# Patient Record
Sex: Female | Born: 1935 | Race: White | Hispanic: No | Marital: Married | Smoking: Former smoker
Health system: Southern US, Community
[De-identification: ages and names within clinical notes are randomized; demographics above are authoritative.]

## PROBLEM LIST (undated history)

## (undated) DIAGNOSIS — K5792 Diverticulitis of intestine, part unspecified, without perforation or abscess without bleeding: Secondary | ICD-10-CM

## (undated) DIAGNOSIS — E538 Deficiency of other specified B group vitamins: Secondary | ICD-10-CM

## (undated) DIAGNOSIS — R413 Other amnesia: Secondary | ICD-10-CM

## (undated) DIAGNOSIS — I10 Essential (primary) hypertension: Secondary | ICD-10-CM

## (undated) DIAGNOSIS — M8580 Other specified disorders of bone density and structure, unspecified site: Secondary | ICD-10-CM

## (undated) DIAGNOSIS — K219 Gastro-esophageal reflux disease without esophagitis: Secondary | ICD-10-CM

## (undated) DIAGNOSIS — E785 Hyperlipidemia, unspecified: Secondary | ICD-10-CM

## (undated) DIAGNOSIS — E039 Hypothyroidism, unspecified: Secondary | ICD-10-CM

## (undated) DIAGNOSIS — Z8679 Personal history of other diseases of the circulatory system: Secondary | ICD-10-CM

## (undated) DIAGNOSIS — K635 Polyp of colon: Secondary | ICD-10-CM

## (undated) DIAGNOSIS — M1990 Unspecified osteoarthritis, unspecified site: Secondary | ICD-10-CM

## (undated) DIAGNOSIS — K644 Residual hemorrhoidal skin tags: Secondary | ICD-10-CM

## (undated) DIAGNOSIS — E559 Vitamin D deficiency, unspecified: Secondary | ICD-10-CM

## (undated) HISTORY — DX: Essential (primary) hypertension: I10

## (undated) HISTORY — DX: Residual hemorrhoidal skin tags: K64.4

## (undated) HISTORY — PX: BREAST CYST EXCISION: SHX579

## (undated) HISTORY — DX: Polyp of colon: K63.5

## (undated) HISTORY — DX: Diverticulitis of intestine, part unspecified, without perforation or abscess without bleeding: K57.92

## (undated) HISTORY — DX: Unspecified osteoarthritis, unspecified site: M19.90

## (undated) HISTORY — DX: Gastro-esophageal reflux disease without esophagitis: K21.9

## (undated) HISTORY — PX: ABDOMINAL HYSTERECTOMY: SHX81

## (undated) HISTORY — DX: Other amnesia: R41.3

## (undated) HISTORY — DX: Other specified disorders of bone density and structure, unspecified site: M85.80

## (undated) HISTORY — PX: TONSILLECTOMY: SUR1361

## (undated) HISTORY — DX: Hyperlipidemia, unspecified: E78.5

## (undated) HISTORY — DX: Deficiency of other specified B group vitamins: E53.8

## (undated) HISTORY — DX: Hypothyroidism, unspecified: E03.9

## (undated) HISTORY — DX: Vitamin D deficiency, unspecified: E55.9

## (undated) HISTORY — DX: Personal history of other diseases of the circulatory system: Z86.79

---

## 2000-01-09 ENCOUNTER — Encounter: Admission: RE | Admit: 2000-01-09 | Discharge: 2000-01-09 | Payer: Self-pay | Admitting: Cardiology

## 2000-01-09 ENCOUNTER — Encounter: Payer: Self-pay | Admitting: Cardiology

## 2001-02-01 ENCOUNTER — Encounter: Payer: Self-pay | Admitting: Internal Medicine

## 2001-02-01 ENCOUNTER — Encounter: Admission: RE | Admit: 2001-02-01 | Discharge: 2001-02-01 | Payer: Self-pay | Admitting: Internal Medicine

## 2001-02-02 ENCOUNTER — Other Ambulatory Visit: Admission: RE | Admit: 2001-02-02 | Discharge: 2001-02-02 | Payer: Self-pay | Admitting: Internal Medicine

## 2001-08-17 ENCOUNTER — Encounter: Admission: RE | Admit: 2001-08-17 | Discharge: 2001-08-17 | Payer: Self-pay | Admitting: Internal Medicine

## 2001-08-17 ENCOUNTER — Encounter: Payer: Self-pay | Admitting: Internal Medicine

## 2001-09-02 ENCOUNTER — Encounter: Payer: Self-pay | Admitting: Internal Medicine

## 2001-09-02 ENCOUNTER — Encounter (INDEPENDENT_AMBULATORY_CARE_PROVIDER_SITE_OTHER): Payer: Self-pay | Admitting: Specialist

## 2001-09-02 ENCOUNTER — Ambulatory Visit (HOSPITAL_COMMUNITY): Admission: RE | Admit: 2001-09-02 | Discharge: 2001-09-02 | Payer: Self-pay | Admitting: Internal Medicine

## 2001-09-05 ENCOUNTER — Encounter: Payer: Self-pay | Admitting: Internal Medicine

## 2001-09-05 ENCOUNTER — Ambulatory Visit (HOSPITAL_COMMUNITY): Admission: RE | Admit: 2001-09-05 | Discharge: 2001-09-05 | Payer: Self-pay | Admitting: Internal Medicine

## 2002-02-06 ENCOUNTER — Encounter: Admission: RE | Admit: 2002-02-06 | Discharge: 2002-02-06 | Payer: Self-pay | Admitting: Internal Medicine

## 2002-02-06 ENCOUNTER — Encounter: Payer: Self-pay | Admitting: Internal Medicine

## 2003-02-21 ENCOUNTER — Encounter: Admission: RE | Admit: 2003-02-21 | Discharge: 2003-02-21 | Payer: Self-pay | Admitting: Internal Medicine

## 2003-02-21 ENCOUNTER — Encounter: Payer: Self-pay | Admitting: Internal Medicine

## 2003-04-10 ENCOUNTER — Encounter: Payer: Self-pay | Admitting: Internal Medicine

## 2003-04-10 ENCOUNTER — Encounter: Admission: RE | Admit: 2003-04-10 | Discharge: 2003-04-10 | Payer: Self-pay | Admitting: Internal Medicine

## 2003-11-21 ENCOUNTER — Other Ambulatory Visit: Payer: Self-pay

## 2004-10-30 ENCOUNTER — Ambulatory Visit (HOSPITAL_COMMUNITY): Admission: RE | Admit: 2004-10-30 | Discharge: 2004-10-30 | Payer: Self-pay | Admitting: Internal Medicine

## 2004-11-13 ENCOUNTER — Ambulatory Visit: Payer: Self-pay | Admitting: Internal Medicine

## 2006-02-10 ENCOUNTER — Ambulatory Visit (HOSPITAL_COMMUNITY): Admission: RE | Admit: 2006-02-10 | Discharge: 2006-02-10 | Payer: Self-pay | Admitting: Internal Medicine

## 2006-05-11 ENCOUNTER — Ambulatory Visit: Payer: Self-pay | Admitting: Internal Medicine

## 2006-08-02 ENCOUNTER — Encounter: Admission: RE | Admit: 2006-08-02 | Discharge: 2006-08-02 | Payer: Self-pay | Admitting: Internal Medicine

## 2007-07-09 ENCOUNTER — Emergency Department (HOSPITAL_COMMUNITY): Admission: EM | Admit: 2007-07-09 | Discharge: 2007-07-09 | Payer: Self-pay | Admitting: Emergency Medicine

## 2008-04-03 ENCOUNTER — Ambulatory Visit (HOSPITAL_COMMUNITY): Admission: RE | Admit: 2008-04-03 | Discharge: 2008-04-03 | Payer: Self-pay | Admitting: Internal Medicine

## 2008-09-19 DIAGNOSIS — Z860100 Personal history of colon polyps, unspecified: Secondary | ICD-10-CM | POA: Insufficient documentation

## 2008-09-19 DIAGNOSIS — K5732 Diverticulitis of large intestine without perforation or abscess without bleeding: Secondary | ICD-10-CM | POA: Insufficient documentation

## 2008-09-19 DIAGNOSIS — E039 Hypothyroidism, unspecified: Secondary | ICD-10-CM | POA: Insufficient documentation

## 2008-09-19 DIAGNOSIS — K644 Residual hemorrhoidal skin tags: Secondary | ICD-10-CM | POA: Insufficient documentation

## 2008-09-19 DIAGNOSIS — I10 Essential (primary) hypertension: Secondary | ICD-10-CM | POA: Insufficient documentation

## 2008-09-19 DIAGNOSIS — K648 Other hemorrhoids: Secondary | ICD-10-CM | POA: Insufficient documentation

## 2008-09-21 ENCOUNTER — Encounter: Payer: Self-pay | Admitting: Internal Medicine

## 2008-12-17 ENCOUNTER — Ambulatory Visit: Payer: Self-pay | Admitting: Internal Medicine

## 2008-12-28 ENCOUNTER — Encounter: Payer: Self-pay | Admitting: Internal Medicine

## 2008-12-28 ENCOUNTER — Ambulatory Visit: Payer: Self-pay | Admitting: Internal Medicine

## 2009-01-01 ENCOUNTER — Encounter: Payer: Self-pay | Admitting: Internal Medicine

## 2009-03-26 ENCOUNTER — Ambulatory Visit: Payer: Self-pay | Admitting: Internal Medicine

## 2009-03-26 DIAGNOSIS — K625 Hemorrhage of anus and rectum: Secondary | ICD-10-CM | POA: Insufficient documentation

## 2009-04-23 ENCOUNTER — Ambulatory Visit (HOSPITAL_COMMUNITY): Admission: RE | Admit: 2009-04-23 | Discharge: 2009-04-23 | Payer: Self-pay | Admitting: Internal Medicine

## 2010-04-24 ENCOUNTER — Ambulatory Visit (HOSPITAL_COMMUNITY): Admission: RE | Admit: 2010-04-24 | Discharge: 2010-04-24 | Payer: Self-pay | Admitting: Internal Medicine

## 2010-11-14 ENCOUNTER — Other Ambulatory Visit: Payer: Self-pay | Admitting: Internal Medicine

## 2010-11-14 DIAGNOSIS — N6321 Unspecified lump in the left breast, upper outer quadrant: Secondary | ICD-10-CM

## 2010-11-18 ENCOUNTER — Ambulatory Visit
Admission: RE | Admit: 2010-11-18 | Discharge: 2010-11-18 | Disposition: A | Discharge status: Home / Self Care | Payer: Medicare Other | Source: Ambulatory Visit | Attending: Internal Medicine | Admitting: Internal Medicine

## 2010-11-18 ENCOUNTER — Ambulatory Visit
Admission: RE | Admit: 2010-11-18 | Discharge: 2010-11-18 | Disposition: A | Discharge status: Home / Self Care | Payer: Self-pay | Source: Ambulatory Visit | Attending: Internal Medicine | Admitting: Internal Medicine

## 2010-11-18 DIAGNOSIS — N6321 Unspecified lump in the left breast, upper outer quadrant: Secondary | ICD-10-CM

## 2010-11-19 ENCOUNTER — Other Ambulatory Visit: Payer: Self-pay

## 2010-12-15 ENCOUNTER — Other Ambulatory Visit: Payer: Self-pay | Admitting: Internal Medicine

## 2010-12-15 DIAGNOSIS — N63 Unspecified lump in unspecified breast: Secondary | ICD-10-CM

## 2010-12-15 DIAGNOSIS — N6489 Other specified disorders of breast: Secondary | ICD-10-CM

## 2010-12-15 DIAGNOSIS — N644 Mastodynia: Secondary | ICD-10-CM

## 2010-12-18 ENCOUNTER — Ambulatory Visit
Admission: RE | Admit: 2010-12-18 | Discharge: 2010-12-18 | Disposition: A | Discharge status: Home / Self Care | Payer: Medicare Other | Source: Ambulatory Visit | Attending: Internal Medicine | Admitting: Internal Medicine

## 2010-12-18 ENCOUNTER — Ambulatory Visit: Payer: Medicare Other

## 2010-12-18 DIAGNOSIS — N6489 Other specified disorders of breast: Secondary | ICD-10-CM

## 2010-12-25 ENCOUNTER — Ambulatory Visit
Admission: RE | Admit: 2010-12-25 | Discharge: 2010-12-25 | Disposition: A | Discharge status: Home / Self Care | Payer: Medicare Other | Source: Ambulatory Visit | Attending: Internal Medicine | Admitting: Internal Medicine

## 2010-12-25 DIAGNOSIS — N644 Mastodynia: Secondary | ICD-10-CM

## 2010-12-25 DIAGNOSIS — N63 Unspecified lump in unspecified breast: Secondary | ICD-10-CM

## 2010-12-25 MED ORDER — GADOBENATE DIMEGLUMINE 529 MG/ML IV SOLN
11.0000 mL | Freq: Once | INTRAVENOUS | Status: AC | PRN
  Administered 2010-12-25: 11 mL via INTRAVENOUS

## 2010-12-26 ENCOUNTER — Ambulatory Visit
Admission: RE | Admit: 2010-12-26 | Discharge: 2010-12-26 | Disposition: A | Discharge status: Home / Self Care | Payer: Medicare Other | Source: Ambulatory Visit | Attending: Internal Medicine | Admitting: Internal Medicine

## 2010-12-26 ENCOUNTER — Other Ambulatory Visit: Payer: Self-pay | Admitting: Internal Medicine

## 2010-12-26 DIAGNOSIS — R928 Other abnormal and inconclusive findings on diagnostic imaging of breast: Secondary | ICD-10-CM

## 2010-12-31 ENCOUNTER — Ambulatory Visit
Admission: RE | Admit: 2010-12-31 | Discharge: 2010-12-31 | Disposition: A | Discharge status: Home / Self Care | Payer: Medicare Other | Source: Ambulatory Visit | Attending: Internal Medicine | Admitting: Internal Medicine

## 2010-12-31 ENCOUNTER — Other Ambulatory Visit: Payer: Self-pay | Admitting: Diagnostic Radiology

## 2010-12-31 ENCOUNTER — Other Ambulatory Visit: Payer: Self-pay | Admitting: Internal Medicine

## 2010-12-31 DIAGNOSIS — R928 Other abnormal and inconclusive findings on diagnostic imaging of breast: Secondary | ICD-10-CM

## 2010-12-31 DIAGNOSIS — N63 Unspecified lump in unspecified breast: Secondary | ICD-10-CM

## 2010-12-31 MED ORDER — GADOBENATE DIMEGLUMINE 529 MG/ML IV SOLN
11.0000 mL | Freq: Once | INTRAVENOUS | Status: AC | PRN
  Administered 2010-12-31: 11 mL via INTRAVENOUS

## 2011-02-27 NOTE — Assessment & Plan Note (Signed)
[facility]                           GASTROENTEROLOGY OFFICE NOTE   Deborah Sanders, Deborah Sanders                       MRN:          [id number]  DATE:05/11/2006                            DOB:          Aug 13, 1931    Referring physician Dr. Rodrigo Ran.   REASON FOR CONSULTATION:  Rectal bleeding.   HISTORY:  This is a 77 year old white female with a history of complicated  diverticulitis for which she underwent segmental colonic resection in 1984.  She also has a history of hypothyroidism, hypertension, and transient  ischemic attack.  She last underwent complete colonoscopy in November of  2002.  She was found to have internal and external hemorrhoids, pan  diverticulosis, and multiple diminutive polyps which were removed and found  to be hyperplastic.  Followup in five years recommended.  She was last seen  in the office November 13, 2004 with diverticulitis successfully treated with  antibiotics.  She presents now with a new problem.  In particular, rectal  bleeding.  Patient will occasionally notice some blood in the stool and  toilet bowel.  However her principal problem is that of intermittent bright  red blood noted on her underwear.  This has occurred principally over the  past four to five weeks.  There has been no associated abdominal or rectal  discomfort with the bleeding.  However she will have some rectal discomfort  periodically due to note hemorrhoids for which she self treats with sitz  baths and some topical creams.  She denies nausea, vomiting, fever, change  in bowel habits or unexplained weight loss.  She recently saw Dr. Waynard Edwards who  advised the patient to be seen to discuss this problem as well as schedule  her surveillance colonoscopy.   CURRENT MEDICATIONS:  1.  Synthroid 112 micrograms daily.  2.  Ziac 10/6.25 mg daily.  3.  Premarin 0.3 mg daily.  4.  Calcium.  5.  Baby aspirin.  6.  Multivitamin.  7.  Vitamin D, E, and  B12.  8.  Vytorin 10/40 mg daily.  9.  Benicar 20 mg daily.  10. B Complex.   ALLERGIES:  MYCINs, CODEINE, DEMEROL .   PAST MEDICAL HISTORY:  As above.   PAST SURGICAL HISTORY:  1.  Partial colectomy for diverticulitis.  2.  Hysterectomy and bilateral oophorectomy.  3.  Bladder tact.  4.  Tonsillectomy.   FAMILY HISTORY:  Negative for gastrointestinal malignancy.   SOCIAL HISTORY:  Patient is married with three children.  She is a retired  Occupational psychologist.  She does not smoke and occasionally uses alcohol.   PHYSICAL EXAMINATION:  GENERAL:  Well appearing female in no acute distress.  VITAL SIGNS:  Blood pressure is 124/80.  Heart rate is 60 and regular.  Weight is 137 pounds.  HEENT:  Sclerae anicteric, conjunctiva are pink.  Oral mucosa intact.  No  adenopathy.  LUNGS:  Clear.  HEART:  Regular.  ABDOMEN:  Soft without tenderness, mass or hernia.  RECTAL:  Reveals external hemorrhoids one of which is clearly inflamed and  tender.  No mass.  EXTREMITIES:  Without edema.   IMPRESSION:  1.  Intermittent rectal bleeding likely due to inflamed external hemorrhoid.  2.  History of diverticulosis complicated by diverticulitis requiring      surgery.  3.  History of colon polyps namely hyperplastic.  Due for followup.  4.  Multiple general medical problems.   RECOMMENDATIONS:  1.  Information on hemorrhoid care.  2.  Sitz baths.  3.  Anusol suppositories at night.  4.  Schedule followup colonoscopy.  The nature of the procedure as well as      the risks, benefits, alternatives have been reviewed. She understands      and has agreed to proceed.  We will provide Osmo prep as an alternative      to the GoLYTELY which she did not tolerate well.                                   Wilhemina Bonito. Eda Keys., MD   JNP/MedQ  DD:  05/11/2006  DT:  05/11/2006  Job #:  [id number]   cc:   Loraine Leriche A. Perini, MD

## 2011-07-24 ENCOUNTER — Emergency Department: Payer: Self-pay | Admitting: Unknown Physician Specialty

## 2012-01-18 ENCOUNTER — Emergency Department: Payer: Self-pay | Admitting: Emergency Medicine

## 2012-04-22 DIAGNOSIS — F079 Unspecified personality and behavioral disorder due to known physiological condition: Secondary | ICD-10-CM | POA: Insufficient documentation

## 2013-03-08 ENCOUNTER — Ambulatory Visit: Payer: Self-pay | Admitting: Neurology

## 2013-03-29 ENCOUNTER — Encounter: Payer: Self-pay | Admitting: Neurology

## 2013-03-29 DIAGNOSIS — F079 Unspecified personality and behavioral disorder due to known physiological condition: Secondary | ICD-10-CM

## 2013-04-10 ENCOUNTER — Ambulatory Visit: Payer: Self-pay | Admitting: Neurology

## 2013-04-10 ENCOUNTER — Encounter: Payer: Self-pay | Admitting: Neurology

## 2013-05-29 ENCOUNTER — Telehealth: Payer: Self-pay | Admitting: Internal Medicine

## 2013-05-29 NOTE — Telephone Encounter (Signed)
Pt having abdominal pain. Dr. Waynard Edwards requests pt be seen as soon as possible, thinks pt may be having a diverticulitis flare. Pt scheduled to see Doug Sou PA tomorrow at 10:30am. Office to fax over records and notify pt of appt date and time.

## 2013-05-30 ENCOUNTER — Ambulatory Visit (INDEPENDENT_AMBULATORY_CARE_PROVIDER_SITE_OTHER): Payer: Medicare Other | Admitting: Gastroenterology

## 2013-05-30 ENCOUNTER — Encounter: Payer: Self-pay | Admitting: Gastroenterology

## 2013-05-30 ENCOUNTER — Telehealth: Payer: Self-pay | Admitting: *Deleted

## 2013-05-30 VITALS — BP 100/60 | HR 58 | Ht 61.0 in | Wt 130.0 lb

## 2013-05-30 DIAGNOSIS — R1032 Left lower quadrant pain: Secondary | ICD-10-CM

## 2013-05-30 MED ORDER — ONDANSETRON HCL 4 MG PO TABS: ORAL_TABLET | ORAL | Status: DC

## 2013-05-30 MED ORDER — HYOSCYAMINE SULFATE 0.125 MG SL SUBL: SUBLINGUAL_TABLET | SUBLINGUAL | Status: DC

## 2013-05-30 MED ORDER — AMOXICILLIN-POT CLAVULANATE 875-125 MG PO TABS: ORAL_TABLET | ORAL | Status: DC

## 2013-05-30 NOTE — Patient Instructions (Addendum)
[facility], [address] [place], [place].  1. zofran ( onddansetron ) 2. Levsin SL ( Hyoscyamine )  3. augmentin 875/175 mg

## 2013-05-30 NOTE — Telephone Encounter (Signed)
I called and spoke to a nurse, Lorene Dy at [employer]. I did a Prior Authorization for nausea, Ondansetron 4 mg, # 20 , 0 refills.  The nurse asked me the reason for the Prior Authorization and I told hier Nausea, Diverticulitis, Abd discomfort.  She approved this prescription  From 05-30-2013 to 05-30-2014.  The patient will be notified by outbound call and or letter. I [facility] [address], [place], [place] to advise.

## 2013-05-30 NOTE — Telephone Encounter (Signed)
I spoke to the patient and advised her the prescription is ready for her to pick up at [facility].  I let her know we called [facility] and they approved the Ondansetrton 4 mg.  She thanked me for calling.

## 2013-05-31 ENCOUNTER — Encounter: Payer: Self-pay | Admitting: Gastroenterology

## 2013-05-31 DIAGNOSIS — R1032 Left lower quadrant pain: Secondary | ICD-10-CM | POA: Insufficient documentation

## 2013-05-31 NOTE — Progress Notes (Signed)
05/31/2013 Deborah Sanders [id number] [birth year]   History of Present Illness:  This is an 79 year old female who is known to Dr. Marina Goodell.  She presents to our office today with her husband due to complaints of LLQ abdominal that has been ongoing for the past couple of weeks.  Started with LLQ abdominal pain about three weeks ago.  Has seen PCP and CT scan was ordered, which did not show any evidence of diverticulitis or pain causing etiology.  CBC was ordered as well.  She was still treated with a course of levoquin and flagyl, however, she is still having pain to the same degree as it was previously.  Pain is constant but does not affect her sleep.  It is described as cramping pain.  Denies blood in stool.  Has some nausea and diarrhea as well, but they have contributed that to the antibiotics themselves.  Has no appetite.  Denies fevers or chills.  She has phenergan to take for the nausea, but patient's husband says that is just "knocks her out" and is asking to try something different.  Her last colonoscopy was in 12/2008 at which time she had moderate diverticulosis throughout the colon and a small tubular adenoma removed with recommended repeat colonoscopy in 5 years from that time.  Her husband helps to give a lot of the story and history since she does have some memory problems with mild dementia.  Current Medications, Allergies, Past Medical History, Past Surgical History, Family History and Social History were reviewed in [employer] record.   Physical Exam: BP 100/60  Pulse 58  Ht 5\' 1"  (1.549 m)  Wt 130 lb (58.968 kg)  BMI 24.58 kg/m2  SpO2 97% General:  Elderly white female in no acute distress Head: Normocephalic and atraumatic Eyes:  sclerae anicteric, conjunctiva pink  Ears: Normal auditory acuity Lungs: Clear throughout to auscultation Heart: Regular rate and rhythm Abdomen: Soft, non-distended.  Bowel sounds present.  Moderate LLQ TTP without  R/R/G. Musculoskeletal: Symmetrical with no gross deformities  Extremities: No edema  Neurological: Alert oriented x 4, grossly nonfocal Psychological:  Alert and cooperative. Normal mood and affect.  Somewhat confused at times.  Assessment and Recommendations: -LLQ abdominal pain:  Is consistent with diverticulitis and feels the same as previous episodes.  No evidence on CT scan and not responding to levo/flagyl after two weeks of treatment.  Will switch antibiotic to Augmentin 875 BID for ten days.  Call with update at the end of the week.  If no improvement ? Repeat CT scan vs flex sig for further evaluation.  Drink plenty of fluids and eat a low fiber diet.  Also gave a prescription for levsin to take every 6 hours as needed for abdominal cramping/spasm.  Zofran given for nausea.

## 2013-06-01 NOTE — Progress Notes (Signed)
Agree with initial assessment and plans 

## 2013-07-28 ENCOUNTER — Encounter (INDEPENDENT_AMBULATORY_CARE_PROVIDER_SITE_OTHER): Payer: Self-pay

## 2013-07-28 DIAGNOSIS — Z0289 Encounter for other administrative examinations: Secondary | ICD-10-CM

## 2013-08-01 ENCOUNTER — Other Ambulatory Visit: Payer: Self-pay | Admitting: *Deleted

## 2013-08-01 DIAGNOSIS — F0280 Dementia in other diseases classified elsewhere without behavioral disturbance: Secondary | ICD-10-CM

## 2013-08-18 ENCOUNTER — Other Ambulatory Visit: Payer: Medicare Other

## 2013-08-18 ENCOUNTER — Ambulatory Visit
Admission: RE | Admit: 2013-08-18 | Discharge: 2013-08-18 | Disposition: A | Discharge status: Home / Self Care | Payer: No Typology Code available for payment source | Source: Ambulatory Visit | Attending: Neurology | Admitting: Neurology

## 2013-08-18 DIAGNOSIS — F0280 Dementia in other diseases classified elsewhere without behavioral disturbance: Secondary | ICD-10-CM

## 2013-11-14 ENCOUNTER — Telehealth: Payer: Self-pay | Admitting: Neurology

## 2013-11-14 NOTE — Telephone Encounter (Signed)
During appointment reminder call, Pt's husband asked if someone will have ready for them the information regarding the MRI that is scheduled for Frida.  They need to know when and where the appointment is.  He said that the appointment was [facility].  Please call

## 2013-11-15 NOTE — Telephone Encounter (Signed)
I spoke to pts husband.  Some confusion about tests pt is scheduled for.  Sherilyn CooterHenry, husband is saying that [facility] [place] [facility] at around 1300, early afternoon.  [facility].   If there is any change to these, please call Sherilyn CooterHenry.  Cell # [phone]

## 2013-11-15 NOTE — Telephone Encounter (Signed)
Returned call to patient, lt VM message for call back concerning MRI, not seen  scheduled in Epic

## 2013-11-16 ENCOUNTER — Encounter (INDEPENDENT_AMBULATORY_CARE_PROVIDER_SITE_OTHER): Payer: Self-pay

## 2013-11-16 DIAGNOSIS — Z0289 Encounter for other administrative examinations: Secondary | ICD-10-CM

## 2013-11-20 ENCOUNTER — Encounter: Payer: Self-pay | Admitting: Internal Medicine

## 2013-12-22 ENCOUNTER — Other Ambulatory Visit: Payer: Self-pay | Admitting: *Deleted

## 2013-12-22 ENCOUNTER — Encounter (INDEPENDENT_AMBULATORY_CARE_PROVIDER_SITE_OTHER): Payer: Self-pay

## 2013-12-22 DIAGNOSIS — Z0289 Encounter for other administrative examinations: Secondary | ICD-10-CM

## 2014-01-19 ENCOUNTER — Encounter (INDEPENDENT_AMBULATORY_CARE_PROVIDER_SITE_OTHER): Payer: Self-pay | Admitting: Neurology

## 2014-01-19 DIAGNOSIS — Z0289 Encounter for other administrative examinations: Secondary | ICD-10-CM

## 2014-02-16 ENCOUNTER — Ambulatory Visit (INDEPENDENT_AMBULATORY_CARE_PROVIDER_SITE_OTHER): Payer: Self-pay | Admitting: Neurology

## 2014-02-16 DIAGNOSIS — G309 Alzheimer's disease, unspecified: Principal | ICD-10-CM

## 2014-02-16 DIAGNOSIS — F0280 Dementia in other diseases classified elsewhere without behavioral disturbance: Secondary | ICD-10-CM

## 2014-02-16 NOTE — Progress Notes (Signed)
Patient was here for follow in the EXPEDITION 3 trial.

## 2014-03-19 ENCOUNTER — Other Ambulatory Visit: Payer: Self-pay

## 2014-05-11 ENCOUNTER — Encounter (INDEPENDENT_AMBULATORY_CARE_PROVIDER_SITE_OTHER): Payer: Self-pay

## 2014-05-11 DIAGNOSIS — Z0289 Encounter for other administrative examinations: Secondary | ICD-10-CM

## 2014-05-15 ENCOUNTER — Encounter: Payer: Self-pay | Admitting: Internal Medicine

## 2014-05-15 ENCOUNTER — Ambulatory Visit (INDEPENDENT_AMBULATORY_CARE_PROVIDER_SITE_OTHER): Payer: Medicare Other | Admitting: Internal Medicine

## 2014-05-15 VITALS — BP 142/96 | HR 72 | Ht 60.0 in | Wt 129.0 lb

## 2014-05-15 DIAGNOSIS — R1032 Left lower quadrant pain: Secondary | ICD-10-CM

## 2014-05-15 DIAGNOSIS — K5732 Diverticulitis of large intestine without perforation or abscess without bleeding: Secondary | ICD-10-CM

## 2014-05-15 DIAGNOSIS — Z86010 Personal history of colonic polyps: Secondary | ICD-10-CM

## 2014-05-15 NOTE — Patient Instructions (Signed)
Please follow up with Dr. Perry as needed 

## 2014-05-15 NOTE — Progress Notes (Signed)
HISTORY OF PRESENT ILLNESS:  Deborah Sanders is a 80 y.o. female with multiple medical problems as listed below. She sent today by Dr. Waynard EdwardsPerini for evaluation of abdominal pain. The patient has a history of complicated diverticulitis for which she underwent partial colectomy. She is also undergone screening and surveillance colonoscopy. She was last seen in the office August 2014 regarding left lower cord abdominal pain despite antibiotics. CT scan at that time did not reveal acute diverticulitis. She was empirically treated with additional antibiotic, given antispasmodic, and Zofran for nausea. Symptoms resolved without recurrence until about 2-3 weeks ago when she developed left lower quadrant pain. She contacted Dr. Waynard EdwardsPerini and was prescribed Levaquin for 7 days. The pain persisted. No associated fever. Slight loose stools. Thereafter she was prescribed Cipro and Flagyl. She continues on Cipro and Flagyl. As of today, her pain has resolved. She is accompanied by her husband. The patient has memory impairment. Her last complete colonoscopy was performed March 2010. She was found to have moderate diverticulosis throughout the colon, as well as internal hemorrhoids and a diminutive polyp which was removed and found to be a benign tubular adenoma.  REVIEW OF SYSTEMS:  All non-GI ROS negative except for memory impairment  Past Medical History  Diagnosis Date  . Memory disturbance   . Hypertension   . Hypothyroidism   . Vitamin B12 deficiency   . Dyslipidemia   . History of cerebrovascular disease     TIA in the past  . GERD (gastroesophageal reflux disease)   . Osteopenia   . Vitamin D deficiency   . Colon polyps     adenomatous  . Degenerative arthritis   . Diverticulitis     with prior surgeries  . External hemorrhoids     Past Surgical History  Procedure Laterality Date  . Tonsillectomy    . Abdominal hysterectomy    . Breast cyst excision      Social History Deborah AlconJane K Sanders  reports  that she quit smoking about 50 years ago. Her smoking use included Cigarettes. She smoked 0.00 packs per day. She does not have any smokeless tobacco history on file. She reports that she does not drink alcohol or use illicit drugs.  family history includes Dementia in her maternal aunt and mother; Heart disease in her father; Stroke in her mother.  Allergies  Allergen Reactions  . Codeine     REACTION: nausea, voming, sedated  . Erythromycin     REACTION: nausea, vomiting  . Meperidine Hcl     REACTION: nausea, vomiting       PHYSICAL EXAMINATION: Vital signs: BP 142/96  Pulse 72  Ht 5' (1.524 m)  Wt 129 lb (58.514 kg)  BMI 25.19 kg/m2 General: Well-developed, well-nourished, no acute distress HEENT: Sclerae are anicteric, conjunctiva pink. Oral mucosa intact Lungs: Clear Heart: Regular Abdomen: soft, mild focal tenderness in left lower quadrant without rebound or guarding, nondistended, no obvious ascites,  normal bowel sounds. No organomegaly. Extremities: No edema Psychiatric: alert and oriented x3. Cooperative    ASSESSMENT:  #1. Left lower quadrant pain. Possibly diverticulitis. Improved after second course of antibiotics #2. History of diminutive tubular adenoma 5 years ago   PLAN:  #1. Complete current course of Cipro and Flagyl (has 3 days remaining) #2. Contact the office for recurrent problems with pain. Of present, would proceed with CT scan of the abdomen and pelvis #3. Aged out of surveillance colonoscopy #4. Ongoing general medical care with Dr. Waynard EdwardsPerini

## 2014-06-11 ENCOUNTER — Ambulatory Visit (INDEPENDENT_AMBULATORY_CARE_PROVIDER_SITE_OTHER): Payer: Self-pay

## 2014-06-11 VITALS — BP 122/80 | HR 61 | Temp 97.6°F | Resp 16 | Ht 61.0 in | Wt 126.4 lb

## 2014-06-11 DIAGNOSIS — G309 Alzheimer's disease, unspecified: Principal | ICD-10-CM

## 2014-06-11 DIAGNOSIS — F0280 Dementia in other diseases classified elsewhere without behavioral disturbance: Secondary | ICD-10-CM

## 2014-06-14 NOTE — Progress Notes (Signed)
Patient was seen for Visit 8 in the Expedition 3 study.  All required study procedures were performed. Subject infusion was started at 12:50pm and stopped at 1:30pm. Subject was sher Visit 9 at this visit.

## 2014-06-27 ENCOUNTER — Encounter: Payer: Self-pay | Admitting: Internal Medicine

## 2014-07-09 ENCOUNTER — Encounter (INDEPENDENT_AMBULATORY_CARE_PROVIDER_SITE_OTHER): Payer: Self-pay

## 2014-07-09 DIAGNOSIS — Z0289 Encounter for other administrative examinations: Secondary | ICD-10-CM

## 2014-08-06 ENCOUNTER — Encounter (INDEPENDENT_AMBULATORY_CARE_PROVIDER_SITE_OTHER): Payer: Self-pay | Admitting: Neurology

## 2014-08-06 DIAGNOSIS — Z0289 Encounter for other administrative examinations: Secondary | ICD-10-CM

## 2014-08-13 ENCOUNTER — Other Ambulatory Visit: Payer: Self-pay | Admitting: Internal Medicine

## 2014-08-13 DIAGNOSIS — N644 Mastodynia: Secondary | ICD-10-CM

## 2014-08-24 ENCOUNTER — Ambulatory Visit
Admission: RE | Admit: 2014-08-24 | Discharge: 2014-08-24 | Disposition: A | Discharge status: Home / Self Care | Payer: Medicare Other | Source: Ambulatory Visit | Attending: Internal Medicine | Admitting: Internal Medicine

## 2014-08-24 DIAGNOSIS — N644 Mastodynia: Secondary | ICD-10-CM

## 2014-10-01 ENCOUNTER — Encounter (INDEPENDENT_AMBULATORY_CARE_PROVIDER_SITE_OTHER): Payer: Self-pay

## 2014-10-01 DIAGNOSIS — Z0289 Encounter for other administrative examinations: Secondary | ICD-10-CM

## 2014-10-11 ENCOUNTER — Encounter (INDEPENDENT_AMBULATORY_CARE_PROVIDER_SITE_OTHER): Payer: Self-pay

## 2014-10-11 DIAGNOSIS — Z0289 Encounter for other administrative examinations: Secondary | ICD-10-CM

## 2014-10-15 ENCOUNTER — Telehealth: Payer: Self-pay | Admitting: Neurology

## 2014-10-15 NOTE — Telephone Encounter (Signed)
Patient's husband called to reschedule patient's Visit 13 on 22JAN2016 at 10:00h for the [facility] trial. Appointment was rescheduled to Monday, 25JAN2016 at 10:00h.

## 2014-11-01 ENCOUNTER — Telehealth: Payer: Self-pay | Admitting: Neurology

## 2014-11-01 NOTE — Telephone Encounter (Signed)
I spoke to the patient to let her know that the office will be closed on 22JAN2016 due to inclement weather. Her appointment will be rescheduled.

## 2014-11-19 ENCOUNTER — Encounter (INDEPENDENT_AMBULATORY_CARE_PROVIDER_SITE_OTHER): Payer: Self-pay

## 2014-11-19 DIAGNOSIS — Z0289 Encounter for other administrative examinations: Secondary | ICD-10-CM

## 2014-12-14 ENCOUNTER — Encounter (INDEPENDENT_AMBULATORY_CARE_PROVIDER_SITE_OTHER): Payer: Self-pay

## 2014-12-14 DIAGNOSIS — Z0289 Encounter for other administrative examinations: Secondary | ICD-10-CM

## 2015-01-11 ENCOUNTER — Encounter (INDEPENDENT_AMBULATORY_CARE_PROVIDER_SITE_OTHER): Payer: Self-pay

## 2015-01-11 DIAGNOSIS — Z0289 Encounter for other administrative examinations: Secondary | ICD-10-CM

## 2015-02-01 ENCOUNTER — Encounter (INDEPENDENT_AMBULATORY_CARE_PROVIDER_SITE_OTHER): Payer: Self-pay

## 2015-02-01 DIAGNOSIS — Z0289 Encounter for other administrative examinations: Secondary | ICD-10-CM

## 2015-02-05 ENCOUNTER — Telehealth: Payer: Self-pay | Admitting: Neurology

## 2015-02-05 NOTE — Telephone Encounter (Signed)
I spoke to the patient's caregiver about changing the patient's appointment for the research trial on 13MAY2016. Since the window visit is limited, I told him that I would call him back after consulting with monitor.

## 2015-02-25 ENCOUNTER — Encounter (INDEPENDENT_AMBULATORY_CARE_PROVIDER_SITE_OTHER): Payer: Self-pay

## 2015-02-25 DIAGNOSIS — Z0289 Encounter for other administrative examinations: Secondary | ICD-10-CM

## 2015-03-22 ENCOUNTER — Encounter (INDEPENDENT_AMBULATORY_CARE_PROVIDER_SITE_OTHER): Payer: Self-pay

## 2015-03-22 DIAGNOSIS — Z0289 Encounter for other administrative examinations: Secondary | ICD-10-CM

## 2015-04-19 ENCOUNTER — Encounter (INDEPENDENT_AMBULATORY_CARE_PROVIDER_SITE_OTHER): Payer: Self-pay | Admitting: Diagnostic Neuroimaging

## 2015-04-19 DIAGNOSIS — Z0289 Encounter for other administrative examinations: Secondary | ICD-10-CM

## 2015-05-13 ENCOUNTER — Ambulatory Visit (INDEPENDENT_AMBULATORY_CARE_PROVIDER_SITE_OTHER): Payer: Self-pay | Admitting: Neurology

## 2015-05-13 DIAGNOSIS — I6340 Cerebral infarction due to embolism of unspecified cerebral artery: Secondary | ICD-10-CM

## 2015-05-13 DIAGNOSIS — I639 Cerebral infarction, unspecified: Secondary | ICD-10-CM

## 2015-05-13 NOTE — Progress Notes (Addendum)
       The  Progress note on this patient for 05/13/15 has been done in error and this patient was not seen  Delia Heady, MD

## 2015-05-31 ENCOUNTER — Telehealth: Payer: Self-pay

## 2015-05-31 NOTE — Telephone Encounter (Signed)
Patient's spouse, Meher Kucinski, called to reschedule the patient's appointment on Monday, 22AUG2016 for the [facility] trial. I told Mr. Wardrop that we would check that the change would not deviate from the study window. If it does not, then the patient will come on Friday, 26AUG2016 at 08:30h. If it does, then we will contact them to reschedule. Mr. Mazer voiced understanding.

## 2015-06-07 ENCOUNTER — Other Ambulatory Visit: Payer: Self-pay | Admitting: Neurology

## 2015-06-07 ENCOUNTER — Encounter (INDEPENDENT_AMBULATORY_CARE_PROVIDER_SITE_OTHER): Payer: Self-pay

## 2015-06-07 DIAGNOSIS — G309 Alzheimer's disease, unspecified: Principal | ICD-10-CM

## 2015-06-07 DIAGNOSIS — F0280 Dementia in other diseases classified elsewhere without behavioral disturbance: Secondary | ICD-10-CM

## 2015-06-07 DIAGNOSIS — Z0289 Encounter for other administrative examinations: Secondary | ICD-10-CM

## 2015-06-12 ENCOUNTER — Telehealth: Payer: Self-pay | Admitting: Diagnostic Neuroimaging

## 2015-06-12 NOTE — Telephone Encounter (Signed)
Brett Canales with [facility] called stating patient called requesting something to be taken before MRI. He can be reached at [phone]

## 2015-06-13 NOTE — Telephone Encounter (Signed)
Broughton with [facility] in [place] has the Rx Xanax for the patient but needs Dr. Richrd Humbles DEA number before filling the Rx. Thank you.

## 2015-06-13 NOTE — Telephone Encounter (Signed)
Per Dr Marjory Lies, spoke with Zella Ball at [facility] and gave verbal order for Xanax 0.5 mg tabs, disp #3, 0 refills to be taken prn prior to MRI. Robin verbalized understanding of verbal order.

## 2015-06-13 NOTE — Telephone Encounter (Signed)
I called back and spoke with Aurther Loft.  Verified DEA with her.  They will proceed with order as prescribed.

## 2015-06-18 ENCOUNTER — Telehealth: Payer: Self-pay

## 2015-06-18 NOTE — Telephone Encounter (Signed)
I spoke to the patient's spouse, Lanice Folden, about the MRI and PET Scan procedures for the Expedition Trial. Mr. Bolt stated that since the patient does not well in enclosed spaces, she would need a tranquilizer for these procedures. I told Mr. Faddis that we would check that the prescription was written, and we would return his call. Mr. Graff can be reached at [phone].

## 2015-06-19 ENCOUNTER — Telehealth: Payer: Self-pay | Admitting: Neurology

## 2015-06-19 NOTE — Telephone Encounter (Signed)
Rn call Heywood Bene at [facility] at [phone]. Heywood Bene stated she needed a prior authorization for PET scan. Rn explain to [place] that pt has scheduling instructions that state no not bill pt or insurance, bill [employer]. Heywood Bene stated she saw the comments under the order, and will let the pre register know.

## 2015-06-19 NOTE — Telephone Encounter (Signed)
Kylie/ [facility] [phone] called to request PA for PET Scan 06/21/15.

## 2015-06-19 NOTE — Telephone Encounter (Signed)
Spoke with patient's husband and informed him that prescription for patient was verbally given to Zella Ball, pharmacist and is ready at [facility] [place]. Informed him this was taken care of on 06/13/15. He stated he would go to pharmacy and speak with Zella Ball today.

## 2015-06-20 NOTE — Telephone Encounter (Signed)
Called Deborah Sanders at his work number however, I was unable to leave a message due to the fact that his voicemail was not set up.  I called the home number and spoke to Deborah Sanders.  I gave her the general phone number where the research department can be reached.  I explained that she needed to come to [facility] to pick up her Xanax medication.  After she picked up her medication, then she would go to [facility] to get her PET Scan.  She told me that her husband was at work but that he would call me back.

## 2015-06-21 ENCOUNTER — Ambulatory Visit (HOSPITAL_COMMUNITY): Admission: RE | Admit: 2015-06-21 | Payer: Self-pay | Source: Ambulatory Visit

## 2015-06-21 ENCOUNTER — Encounter (HOSPITAL_COMMUNITY)
Admission: RE | Admit: 2015-06-21 | Discharge: 2015-06-21 | Disposition: A | Discharge status: Home / Self Care | Payer: Medicare Other | Source: Ambulatory Visit | Attending: Neurology | Admitting: Neurology

## 2015-06-21 DIAGNOSIS — F0280 Dementia in other diseases classified elsewhere without behavioral disturbance: Secondary | ICD-10-CM

## 2015-06-21 DIAGNOSIS — G309 Alzheimer's disease, unspecified: Secondary | ICD-10-CM | POA: Diagnosis not present

## 2015-06-21 MED ORDER — FLUDEOXYGLUCOSE F - 18 (FDG) INJECTION
9.9000 | Freq: Once | INTRAVENOUS | Status: DC | PRN
  Administered 2015-06-21: 9.9 via INTRAVENOUS
  Filled 2015-06-21: qty 9.9

## 2015-06-24 ENCOUNTER — Telehealth: Payer: Self-pay | Admitting: Neurology

## 2015-06-24 ENCOUNTER — Ambulatory Visit (HOSPITAL_COMMUNITY): Payer: Self-pay

## 2015-06-24 NOTE — Telephone Encounter (Signed)
I spoke with Deborah Sanders and [facility].  The MRI appointment has been rescheduled for Friday, Sept, 16th, 2016 at 12:45pm at [facility]

## 2015-06-24 NOTE — Telephone Encounter (Signed)
I called Deborah Sanders on both his home and cellular number (and left messages on both) to reach him to reschedule his MRI appointment.  Upon speaking with [facility], they said that they spoke with the patient Deborah Sanders) on 06/11/15 and she said that she would call them back to reschedule and never did.  Upon speaking with [facility], I let them know that for the future, they should speak with Deborah Sanders with regards to appointments.  Also, they moved Deborah Sanders to Room 1 since she is claustrophobic.  [facility] said they would try to get in touch with Deborah Sanders as well.

## 2015-06-28 ENCOUNTER — Other Ambulatory Visit: Payer: Medicare Other

## 2015-06-28 ENCOUNTER — Inpatient Hospital Stay: Admission: RE | Admit: 2015-06-28 | Payer: Medicare Other | Source: Ambulatory Visit

## 2015-07-01 ENCOUNTER — Other Ambulatory Visit: Payer: Self-pay | Admitting: Neurology

## 2015-07-01 ENCOUNTER — Ambulatory Visit
Admission: RE | Admit: 2015-07-01 | Discharge: 2015-07-01 | Disposition: A | Discharge status: Home / Self Care | Payer: No Typology Code available for payment source | Source: Ambulatory Visit | Attending: Neurology | Admitting: Neurology

## 2015-07-01 DIAGNOSIS — G309 Alzheimer's disease, unspecified: Principal | ICD-10-CM

## 2015-07-01 DIAGNOSIS — F0280 Dementia in other diseases classified elsewhere without behavioral disturbance: Secondary | ICD-10-CM

## 2015-07-01 MED ORDER — DIAZEPAM 5 MG PO TABS: 2.5000 mg | ORAL_TABLET | Freq: Four times a day (QID) | ORAL | Status: DC | PRN

## 2015-07-05 ENCOUNTER — Ambulatory Visit
Admission: RE | Admit: 2015-07-05 | Discharge: 2015-07-05 | Disposition: A | Discharge status: Home / Self Care | Payer: PRIVATE HEALTH INSURANCE | Source: Ambulatory Visit | Attending: Neurology | Admitting: Neurology

## 2015-07-05 DIAGNOSIS — G309 Alzheimer's disease, unspecified: Principal | ICD-10-CM

## 2015-07-05 DIAGNOSIS — F0280 Dementia in other diseases classified elsewhere without behavioral disturbance: Secondary | ICD-10-CM

## 2015-07-08 ENCOUNTER — Ambulatory Visit (INDEPENDENT_AMBULATORY_CARE_PROVIDER_SITE_OTHER): Payer: Self-pay | Admitting: Diagnostic Neuroimaging

## 2015-07-08 DIAGNOSIS — G309 Alzheimer's disease, unspecified: Principal | ICD-10-CM

## 2015-07-08 DIAGNOSIS — Z0289 Encounter for other administrative examinations: Secondary | ICD-10-CM

## 2015-07-08 DIAGNOSIS — F0280 Dementia in other diseases classified elsewhere without behavioral disturbance: Secondary | ICD-10-CM

## 2015-07-09 NOTE — Progress Notes (Signed)
EXPEDTION 3 LZAX study Visit # 22 note  Patient was seen today for the study visit. She had neurological exam and physical exam performed as per study protocol. Patient has been were given opportunity to ask questions which were answered. Patient tolerated study infusion without any immediate complications.  Physical Exam : . Afebrile. Head is nontraumatic. Neck is supple without bruit.    Cardiac exam no murmur or gallop. Lungs are clear to auscultation. Distal pulses are well felt.  Neurological Exam : Awake alert disoriented. Diminished attention, registration and recall. Follows only simple one-step commands. Mini-Mental status exam not performed by me. Extraocular movements are full range without nystagmus. Blinks to threat bilaterally. Face is symmetric without weakness. Tongue is midline. Cough and gag are adequate. Motor system exam no upper or lower extremity drift. Symmetric and equal strength in all 4 extremities. No focal weakness. Deep tendon for this are 2+ symmetric. Plantars are downgoing. Touch pinprick sensation are preserved bilaterally. Gait is slow cautious but steady. Slight difficulty with tandem walking. Data reviewed : Study specific MRI scan of the brain performed on 07/05/15 shows moderate diffuse cortical, perisylvian and mesial temporal atrophy with mild ventriculomegaly and changes of small vessel disease. No significant hemorrhages on gradient-echo or edema on FLAIR images.Marland Kitchen PET scan with amyloid was performed as per study protocol on 06/21/15 and results are not available  IMPRESSION : Patient is doing well in the study. She was advised to follow up as per study protocol.  Delia Heady, MD

## 2015-07-09 NOTE — Progress Notes (Signed)
Patient was seen for Visit 22 for the [facility] study. Patient entered the Open-Label part.

## 2015-08-01 ENCOUNTER — Telehealth: Payer: Self-pay

## 2015-08-01 NOTE — Telephone Encounter (Signed)
The patient's spouse was calling to confirm the patient's appointment for the Expedition trial on 24OCT2016 at 09:30am. I was able to confirm this appointment with Mr. Deborah Sanders.

## 2015-09-17 ENCOUNTER — Other Ambulatory Visit: Payer: Self-pay | Admitting: Neurology

## 2015-09-17 DIAGNOSIS — F0280 Dementia in other diseases classified elsewhere without behavioral disturbance: Secondary | ICD-10-CM

## 2015-09-17 DIAGNOSIS — G301 Alzheimer's disease with late onset: Principal | ICD-10-CM

## 2015-09-18 ENCOUNTER — Emergency Department: Payer: Medicare Other

## 2015-09-18 ENCOUNTER — Emergency Department
Admission: EM | Admit: 2015-09-18 | Discharge: 2015-09-18 | Disposition: A | Discharge status: Home / Self Care | Payer: Medicare Other | Attending: Emergency Medicine | Admitting: Emergency Medicine

## 2015-09-18 ENCOUNTER — Encounter: Payer: Self-pay | Admitting: Emergency Medicine

## 2015-09-18 DIAGNOSIS — R630 Anorexia: Secondary | ICD-10-CM | POA: Diagnosis not present

## 2015-09-18 DIAGNOSIS — R109 Unspecified abdominal pain: Secondary | ICD-10-CM | POA: Insufficient documentation

## 2015-09-18 DIAGNOSIS — R0602 Shortness of breath: Secondary | ICD-10-CM | POA: Diagnosis present

## 2015-09-18 DIAGNOSIS — Z79899 Other long term (current) drug therapy: Secondary | ICD-10-CM | POA: Insufficient documentation

## 2015-09-18 DIAGNOSIS — R197 Diarrhea, unspecified: Secondary | ICD-10-CM | POA: Diagnosis not present

## 2015-09-18 DIAGNOSIS — I10 Essential (primary) hypertension: Secondary | ICD-10-CM | POA: Insufficient documentation

## 2015-09-18 DIAGNOSIS — R0600 Dyspnea, unspecified: Secondary | ICD-10-CM

## 2015-09-18 DIAGNOSIS — Z87891 Personal history of nicotine dependence: Secondary | ICD-10-CM | POA: Diagnosis not present

## 2015-09-18 LAB — CBC WITH DIFFERENTIAL/PLATELET
Basophils Absolute: 0 10*3/uL (ref 0–0.1)
Basophils Relative: 0 %
Eosinophils Absolute: 0.1 10*3/uL (ref 0–0.7)
Eosinophils Relative: 2 %
HCT: 42.4 % (ref 35.0–47.0)
Hemoglobin: 13.7 g/dL (ref 12.0–16.0)
Lymphocytes Relative: 34 %
Lymphs Abs: 1.9 10*3/uL (ref 1.0–3.6)
MCH: 31.3 pg (ref 26.0–34.0)
MCHC: 32.4 g/dL (ref 32.0–36.0)
MCV: 96.7 fL (ref 80.0–100.0)
Monocytes Absolute: 0.6 10*3/uL (ref 0.2–0.9)
Monocytes Relative: 12 %
Neutro Abs: 2.9 10*3/uL (ref 1.4–6.5)
Neutrophils Relative %: 52 %
Platelets: 134 10*3/uL — ABNORMAL LOW (ref 150–440)
RBC: 4.39 MIL/uL (ref 3.80–5.20)
RDW: 13.1 % (ref 11.5–14.5)
WBC: 5.5 10*3/uL (ref 3.6–11.0)

## 2015-09-18 LAB — URINALYSIS COMPLETE WITH MICROSCOPIC (ARMC ONLY)
Bilirubin Urine: NEGATIVE
Glucose, UA: NEGATIVE mg/dL
Hgb urine dipstick: NEGATIVE
Ketones, ur: NEGATIVE mg/dL
Leukocytes, UA: NEGATIVE
Nitrite: NEGATIVE
Protein, ur: NEGATIVE mg/dL
Specific Gravity, Urine: 1.002 — ABNORMAL LOW (ref 1.005–1.030)
pH: 6 (ref 5.0–8.0)

## 2015-09-18 LAB — COMPREHENSIVE METABOLIC PANEL
ALT: 31 U/L (ref 14–54)
AST: 38 U/L (ref 15–41)
Albumin: 3.7 g/dL (ref 3.5–5.0)
Alkaline Phosphatase: 38 U/L (ref 38–126)
Anion gap: 5 (ref 5–15)
BUN: 15 mg/dL (ref 6–20)
CO2: 23 mmol/L (ref 22–32)
Calcium: 7.6 mg/dL — ABNORMAL LOW (ref 8.9–10.3)
Chloride: 111 mmol/L (ref 101–111)
Creatinine, Ser: 0.83 mg/dL (ref 0.44–1.00)
GFR calc Af Amer: >60 mL/min (ref >60)
GFR calc non Af Amer: >60 mL/min (ref >60)
Glucose, Bld: 101 mg/dL — ABNORMAL HIGH (ref 65–99)
Potassium: 4.1 mmol/L (ref 3.5–5.1)
Sodium: 139 mmol/L (ref 135–145)
Total Bilirubin: 0.3 mg/dL (ref 0.3–1.2)
Total Protein: 6.4 g/dL — ABNORMAL LOW (ref 6.5–8.1)

## 2015-09-18 MED ORDER — IOHEXOL 240 MG/ML SOLN: 25.0000 mL | Freq: Once | INTRAMUSCULAR | Status: DC | PRN

## 2015-09-18 MED ORDER — IOHEXOL 300 MG/ML  SOLN
100.0000 mL | Freq: Once | INTRAMUSCULAR | Status: AC | PRN
  Administered 2015-09-18: 100 mL via INTRAVENOUS

## 2015-09-18 MED ORDER — SODIUM CHLORIDE 0.9 % IV BOLUS (SEPSIS)
1000.0000 mL | Freq: Once | INTRAVENOUS | Status: AC
  Administered 2015-09-18: 1000 mL via INTRAVENOUS

## 2015-09-18 NOTE — Discharge Instructions (Signed)
Diarrhea Diarrhea is frequent loose and watery bowel movements. It can cause you to feel weak and dehydrated. Dehydration can cause you to become tired and thirsty, have a dry mouth, and have decreased urination that often is dark yellow. Diarrhea is a sign of another problem, most often an infection that will not last long. In most cases, diarrhea typically lasts 2-3 days. However, it can last longer if it is a sign of something more serious. It is important to treat your diarrhea as directed by your caregiver to lessen or prevent future episodes of diarrhea. CAUSES  Some common causes include:  Gastrointestinal infections caused by viruses, bacteria, or parasites.  Food poisoning or food allergies.  Certain medicines, such as antibiotics, chemotherapy, and laxatives.  Artificial sweeteners and fructose.  Digestive disorders. HOME CARE INSTRUCTIONS  Ensure adequate fluid intake (hydration): Have 1 cup (8 oz) of fluid for each diarrhea episode. Avoid fluids that contain simple sugars or sports drinks, fruit juices, whole milk products, and sodas. Your urine should be clear or pale yellow if you are drinking enough fluids. Hydrate with an oral rehydration solution that you can purchase at pharmacies, retail stores, and online. You can prepare an oral rehydration solution at home by mixing the following ingredients together:   - tsp table salt.   tsp baking soda.   tsp salt substitute containing potassium chloride.  1  tablespoons sugar.  1 L (34 oz) of water.  Certain foods and beverages may increase the speed at which food moves through the gastrointestinal (GI) tract. These foods and beverages should be avoided and include:  Caffeinated and alcoholic beverages.  High-fiber foods, such as raw fruits and vegetables, nuts, seeds, and whole grain breads and cereals.  Foods and beverages sweetened with sugar alcohols, such as xylitol, sorbitol, and mannitol.  Some foods may be well  tolerated and may help thicken stool including:  Starchy foods, such as rice, toast, pasta, low-sugar cereal, oatmeal, grits, baked potatoes, crackers, and bagels.  Bananas.  Applesauce.  Add probiotic-rich foods to help increase healthy bacteria in the GI tract, such as yogurt and fermented milk products.  Wash your hands well after each diarrhea episode.  Only take over-the-counter or prescription medicines as directed by your caregiver.  Take a warm bath to relieve any burning or pain from frequent diarrhea episodes. SEEK IMMEDIATE MEDICAL CARE IF:   You are unable to keep fluids down.  You have persistent vomiting.  You have blood in your stool, or your stools are black and tarry.  You do not urinate in 6-8 hours, or there is only a small amount of very dark urine.  You have abdominal pain that increases or localizes.  You have weakness, dizziness, confusion, or light-headedness.  You have a severe headache.  Your diarrhea gets worse or does not get better.  You have a fever or persistent symptoms for more than 2-3 days.  You have a fever and your symptoms suddenly get worse. MAKE SURE YOU:   Understand these instructions.  Will watch your condition.  Will get help right away if you are not doing well or get worse.   This information is not intended to replace advice given to you by your health care provider. Make sure you discuss any questions you have with your health care provider.   Document Released: 09/18/2002 Document Revised: 10/19/2014 Document Reviewed: 06/05/2012 Elsevier Interactive Patient Education 2016 [employer].  Shortness of Breath Shortness of breath means you have trouble breathing.  It could also mean that you have a medical problem. You should get immediate medical care for shortness of breath. CAUSES   Not enough oxygen in the air such as with high altitudes or a smoke-filled room.  Certain lung diseases, infections, or  problems.  Heart disease or conditions, such as angina or heart failure.  Low red blood cells (anemia).  Poor physical fitness, which can cause shortness of breath when you exercise.  Chest or back injuries or stiffness.  Being overweight.  Smoking.  Anxiety, which can make you feel like you are not getting enough air. DIAGNOSIS  Serious medical problems can often be found during your physical exam. Tests may also be done to determine why you are having shortness of breath. Tests may include:  Chest X-rays.  Lung function tests.  Blood tests.  An electrocardiogram (ECG).  An ambulatory electrocardiogram. An ambulatory ECG records your heartbeat patterns over a 24-hour period.  Exercise testing.  A transthoracic echocardiogram (TTE). During echocardiography, sound waves are used to evaluate how blood flows through your heart.  A transesophageal echocardiogram (TEE).  Imaging scans. Your health care provider may not be able to find a cause for your shortness of breath after your exam. In this case, it is important to have a follow-up exam with your health care provider as directed.  TREATMENT  Treatment for shortness of breath depends on the cause of your symptoms and can vary greatly. HOME CARE INSTRUCTIONS   Do not smoke. Smoking is a common cause of shortness of breath. If you smoke, ask for help to quit.  Avoid being around chemicals or things that may bother your breathing, such as paint fumes and dust.  Rest as needed. Slowly resume your usual activities.  If medicines were prescribed, take them as directed for the full length of time directed. This includes oxygen and any inhaled medicines.  Keep all follow-up appointments as directed by your health care provider. SEEK MEDICAL CARE IF:   Your condition does not improve in the time expected.  You have a hard time doing your normal activities even with rest.  You have any new symptoms. SEEK IMMEDIATE MEDICAL  CARE IF:   Your shortness of breath gets worse.  You feel light-headed, faint, or develop a cough not controlled with medicines.  You start coughing up blood.  You have pain with breathing.  You have chest pain or pain in your arms, shoulders, or abdomen.  You have a fever.  You are unable to walk up stairs or exercise the way you normally do. MAKE SURE YOU:  Understand these instructions.  Will watch your condition.  Will get help right away if you are not doing well or get worse.   This information is not intended to replace advice given to you by your health care provider. Make sure you discuss any questions you have with your health care provider.   Document Released: 06/23/2001 Document Revised: 10/03/2013 Document Reviewed: 12/14/2011 Elsevier Interactive Patient Education [employer]2016 Elsevier Inc.

## 2015-09-18 NOTE — ED Notes (Signed)
Notified CT that lab results are showing , CT to come as soon as possible

## 2015-09-18 NOTE — ED Provider Notes (Signed)
[facility] Emergency Department Provider Note  ____________________________________________  Time seen: 10:50 AM on arrival by EMS  I have reviewed the triage vital signs and the nursing notes.   HISTORY  Chief Complaint Shortness of Breath    HPI Deborah Sanders is a 81 y.o. female who complains of shortness of breath this morningwhile she was home alone. She does have dementia, so her husband called EMS to transport the patient. Patient has had diarrhea and abdominal pain and decreased appetite for about one week which seems to be improving. No chest pain or dizziness or syncope. No cough. No recent travel trauma hospitalizations or surgeries.     Past Medical History  Diagnosis Date  . Memory disturbance   . Hypertension   . Hypothyroidism   . Vitamin B12 deficiency   . Dyslipidemia   . History of cerebrovascular disease     TIA in the past  . GERD (gastroesophageal reflux disease)   . Osteopenia   . Vitamin D deficiency   . Colon polyps     adenomatous  . Degenerative arthritis   . Diverticulitis     with prior surgeries  . External hemorrhoids      Patient Active Problem List   Diagnosis Date Noted  . LLQ abdominal pain 05/31/2013  . 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 RECTAL BLEEDING 03/26/2009  . HYPOTHYROIDISM 09/19/2008  . HYPERTENSION 09/19/2008  . HEMORRHOIDS, INTERNAL 09/19/2008  . EXTERNAL HEMORRHOIDS 09/19/2008  . DIVERTICULITIS, COLON 09/19/2008  . COLONIC POLYPS, HX OF 09/19/2008     Past Surgical History  Procedure Laterality Date  . Tonsillectomy    . Abdominal hysterectomy    . Breast cyst excision       Current Outpatient Rx  Name  Route  Sig  Dispense  Refill  . CALCIUM-MAGNESIUM-ZINC PO   Oral   Take 1 tablet by mouth daily.         . Coenzyme Q10 (COQ10 PO)   Oral   Take 1 tablet by mouth daily.         Marland Kitchen estradiol (ESTRACE) 0.5 MG tablet  Oral   Take 0.5 mg by mouth daily.         Marland Kitchen ezetimibe-simvastatin (VYTORIN) 10-40 MG per tablet   Oral   Take 1 tablet by mouth at bedtime.         . ibandronate (BONIVA) 150 MG tablet   Oral   Take 150 mg by mouth every 30 (thirty) days. Take in the morning with a full glass of water, on an empty stomach, and do not take anything else by mouth or lie down for the next 30 min.         Marland Kitchen levothyroxine (SYNTHROID, LEVOTHROID) 75 MCG tablet   Oral   Take 75 mcg by mouth daily before breakfast.          . Multiple Vitamins-Minerals (CENTRUM SILVER ADULT 50+ PO)   Oral   Take 1 tablet by mouth daily.         Marland Kitchen NAMENDA XR 28 MG CP24               . omeprazole (PRILOSEC OTC) 20 MG tablet   Oral   Take 20 mg by mouth as needed.          . vitamin B-12 (CYANOCOBALAMIN) 500 MCG tablet   Oral   Take 500 mcg by mouth daily.         Marland Kitchen  diazepam (VALIUM) 5 MG tablet   Oral   Take 0.5 tablets (2.5 mg total) by mouth every 6 (six) hours as needed for anxiety.   1 tablet   0     Take half tablet 30 min before MRI. Repeat once if ...      Allergies Codeine; Erythromycin; and Meperidine hcl   Family History  Problem Relation Age of Onset  . Stroke Mother   . Dementia Mother   . Heart disease Father   . Dementia Maternal Aunt     Social History Social History  Substance Use Topics  . Smoking status: Former Smoker    Types: Cigarettes    Quit date: 10/13/1963  . Smokeless tobacco: None  . Alcohol Use: No     Comment: Consumes alcohol on occasion    Review of Systems  Constitutional:   No fever or chills. No weight changes Eyes:   No blurry vision or double vision.  ENT:   No sore throat. Cardiovascular:   No chest pain. Respiratory:   Positive dyspnea without cough. Gastrointestinal:   Positive for abdominal pain, without vomiting and with diarrhea.  No BRBPR or melena. Genitourinary:   Negative for dysuria, urinary retention, bloody urine, or  difficulty urinating. Musculoskeletal:   Negative for back pain. No joint swelling or pain. Skin:   Negative for rash. Neurological:   Negative for headaches, focal weakness or numbness. Psychiatric:  No anxiety or depression.   Endocrine:  No hot/cold intolerance, changes in energy, or sleep difficulty.  10-point ROS otherwise negative.  ____________________________________________   PHYSICAL EXAM:  VITAL SIGNS: ED Triage Vitals  Enc Vitals Group     BP 09/18/15 1100 136/80 mmHg     Pulse Rate 09/18/15 1100 60     Resp 09/18/15 1100 9     Temp 09/18/15 1112 98.1 F (36.7 C)     Temp src --      SpO2 09/18/15 1100 97 %     Weight 09/18/15 1112 140 lb (63.504 kg)     Height 09/18/15 1112  (1.575 m)     Head Cir --      Peak Flow --      Pain Score --      Pain Loc --      Pain Edu? --      Excl. in GC? --      Constitutional:   Alert and oriented. Well appearing and in no distress. Eyes:   No scleral icterus. No conjunctival pallor. PERRL. EOMI ENT   Head:   Normocephalic and atraumatic.   Nose:   No congestion/rhinnorhea. No septal hematoma   Mouth/Throat:   MMM, no pharyngeal erythema. No peritonsillar mass. No uvula shift.   Neck:   No stridor. No SubQ emphysema. No meningismus. Hematological/Lymphatic/Immunilogical:   No cervical lymphadenopathy. Cardiovascular:   RRR. Normal and symmetric distal pulses are present in all extremities. No murmurs, rubs, or gallops. Respiratory:   Normal respiratory effort without tachypnea nor retractions. Breath sounds are clear and equal bilaterally. No wheezes/rales/rhonchi. Gastrointestinal:   Soft with diffuse abdominal tenderness, nonfocal. No distention. There is no CVA tenderness.  No rebound, rigidity, or guarding. Genitourinary:   deferred Musculoskeletal:   Nontender with normal range of motion in all extremities. No joint effusions.  No lower extremity tenderness.  No edema. Neurologic:   Normal speech  and language.  CN 2-10 normal. Motor grossly intact.  No gross focal neurologic deficits are appreciated.  Skin:  Skin is warm, dry and intact. No rash noted.  No petechiae, purpura, or bullae. Psychiatric:   Mood and affect are normal. Speech and behavior are normal. Patient exhibits appropriate insight and judgment.  ____________________________________________    LABS (pertinent positives/negatives) (all labs ordered are listed, but only abnormal results are displayed) Labs Reviewed  CBC WITH DIFFERENTIAL/PLATELET - Abnormal; Notable for the following:    Platelets 134 (*)    All other components within normal limits  COMPREHENSIVE METABOLIC PANEL - Abnormal; Notable for the following:    Glucose, Bld 101 (*)    Calcium 7.6 (*)    Total Protein 6.4 (*)    All other components within normal limits  URINALYSIS COMPLETEWITH MICROSCOPIC ([facility] ONLY) - Abnormal; Notable for the following:    Color, Urine STRAW (*)    APPearance CLEAR (*)    Specific Gravity, Urine 1.002 (*)    Bacteria, UA RARE (*)    Squamous Epithelial / LPF 0-5 (*)    All other components within normal limits   ____________________________________________   EKG  Interpreted by me Normal sinus rhythm rate of 60, left axis, normal intervals. Poor R-wave progression in anterior precordial leads, normal ST segments and T waves  ____________________________________________    RADIOLOGY  Chest x-ray unremarkable CT abdomen unremarkable  ____________________________________________   PROCEDURES   ____________________________________________   INITIAL IMPRESSION / ASSESSMENT AND PLAN / ED COURSE  Pertinent labs & imaging results that were available during my care of the patient were reviewed by me and considered in my medical decision making (see chart for details).  Patient presents with shortness of breath and abdominal tenderness. Overall the patient is well-appearing, no acute distress,  unremarkable vital signs. Low suspicion for any serious pathology but given her dementia and age we'll check labs urinalysis chest x-ray, and CT abdomen and pelvis to evaluate for diverticulitis.  ----------------------------------------- 3:25 PM on 09/18/2015 -----------------------------------------  Workup negative, vital stable, symptoms are improved and resolved. Patient feels back to normal, and family is comfortable that she is at her baseline as well. Counseled on hydration as she likely has a viral GI illness. No evidence on CT of any vascular pathology, infection. She does have a gallstone, but no evidence of obstruction or cholecystitis. Repeat abdominal exam shows that she is completely nontender in the right upper quadrant.     ____________________________________________   FINAL CLINICAL IMPRESSION(S) / ED DIAGNOSES  Final diagnoses:  Dyspnea  Diarrhea, unspecified type      Sharman CheekPhillip Raphaella Larkin, MD 09/18/15 1526

## 2015-09-18 NOTE — ED Notes (Addendum)
Patient called her husband from their home while he was at work today, complaining that she was experiencing shortness of breath.  EMS was called and family requested that she be checked medically.  Family states that the patient has been having slightly more dementia than normal in the last several months, but states that she seems at her baseline today.  Of note, pt has had diarrhea, stomach pain and decreased appetite for approximately 1 week, but is getting better now, per husband's report.  Patient is in no distress and VS are normal.

## 2015-09-19 ENCOUNTER — Telehealth: Payer: Self-pay | Admitting: Neurology

## 2015-09-19 NOTE — Telephone Encounter (Signed)
I spoke to Phelps DodgeHenry Bleich.  I let him know that the Expedition trial is closing and that I will have to schedule Karie SodaJane Cantrelle for an Early Discontinuation visit.  I also let him know that I will need to schedule her for an MRI and PET Scan before this is done.  Sherilyn CooterHenry requested that I wait until January to schedule any appointments for Karie SodaJane Delk.  He also requested that I schedule dates on Mondays and Fridays.  I told him that I will work on scheduling them for an MRI and PET Scan.  Once this is done, I will call Remi HaggardHenry Dotson back and schedule the ED visit for Schering-PloughJane Totton.

## 2015-09-30 ENCOUNTER — Encounter: Payer: Self-pay | Admitting: Diagnostic Neuroimaging

## 2015-10-02 ENCOUNTER — Telehealth: Payer: Self-pay | Admitting: Neurology

## 2015-10-02 NOTE — Telephone Encounter (Signed)
I spoke with Deborah HaggardHenry Sanders.  I let him know the scheduled dates for the MRI and PET Scan for Deborah Sanders.  I told him that the date for the PET Scan (10/25/15 at 2pm) was tentative until I heard back from scheduling.  Once I could confirm the date, I would call him back.  [facility]  We scheduled it for 11/11/15 at 9:30am.

## 2015-10-14 ENCOUNTER — Encounter: Payer: Self-pay | Admitting: Emergency Medicine

## 2015-10-14 ENCOUNTER — Emergency Department
Admission: EM | Admit: 2015-10-14 | Discharge: 2015-10-14 | Disposition: A | Discharge status: Home / Self Care | Payer: Medicare Other | Attending: Emergency Medicine | Admitting: Emergency Medicine

## 2015-10-14 ENCOUNTER — Other Ambulatory Visit: Payer: Self-pay

## 2015-10-14 DIAGNOSIS — I10 Essential (primary) hypertension: Secondary | ICD-10-CM | POA: Diagnosis not present

## 2015-10-14 DIAGNOSIS — R51 Headache: Secondary | ICD-10-CM | POA: Insufficient documentation

## 2015-10-14 DIAGNOSIS — Z79899 Other long term (current) drug therapy: Secondary | ICD-10-CM | POA: Insufficient documentation

## 2015-10-14 DIAGNOSIS — Z87891 Personal history of nicotine dependence: Secondary | ICD-10-CM | POA: Insufficient documentation

## 2015-10-14 DIAGNOSIS — R519 Headache, unspecified: Secondary | ICD-10-CM

## 2015-10-14 DIAGNOSIS — R1032 Left lower quadrant pain: Secondary | ICD-10-CM

## 2015-10-14 LAB — SEDIMENTATION RATE: SED RATE: 71 mm/h — AB (ref 0–30)

## 2015-10-14 LAB — CBC
HEMATOCRIT: 39.5 % (ref 35.0–47.0)
Hemoglobin: 13.1 g/dL (ref 12.0–16.0)
MCH: 31.5 pg (ref 26.0–34.0)
MCHC: 33.2 g/dL (ref 32.0–36.0)
MCV: 95 fL (ref 80.0–100.0)
Platelets: 222 10*3/uL (ref 150–440)
RBC: 4.16 MIL/uL (ref 3.80–5.20)
RDW: 12.8 % (ref 11.5–14.5)
WBC: 10.8 10*3/uL (ref 3.6–11.0)

## 2015-10-14 LAB — BASIC METABOLIC PANEL
ANION GAP: 9 (ref 5–15)
BUN: 15 mg/dL (ref 6–20)
CO2: 25 mmol/L (ref 22–32)
Calcium: 9.1 mg/dL (ref 8.9–10.3)
Chloride: 95 mmol/L — ABNORMAL LOW (ref 101–111)
Creatinine, Ser: 1.02 mg/dL — ABNORMAL HIGH (ref 0.44–1.00)
GFR, EST AFRICAN AMERICAN: 58 mL/min — AB (ref >60)
GFR, EST NON AFRICAN AMERICAN: 50 mL/min — AB (ref >60)
Glucose, Bld: 112 mg/dL — ABNORMAL HIGH (ref 65–99)
POTASSIUM: 4.2 mmol/L (ref 3.5–5.1)
SODIUM: 129 mmol/L — AB (ref 135–145)

## 2015-10-14 LAB — URINALYSIS COMPLETE WITH MICROSCOPIC (ARMC ONLY)
BILIRUBIN URINE: NEGATIVE
GLUCOSE, UA: NEGATIVE mg/dL
HGB URINE DIPSTICK: NEGATIVE
Ketones, ur: NEGATIVE mg/dL
LEUKOCYTES UA: NEGATIVE
NITRITE: NEGATIVE
Protein, ur: NEGATIVE mg/dL
SPECIFIC GRAVITY, URINE: 1.005 (ref 1.005–1.030)
pH: 7 (ref 5.0–8.0)

## 2015-10-14 MED ORDER — SODIUM CHLORIDE 0.9 % IV BOLUS (SEPSIS)
500.0000 mL | INTRAVENOUS | Status: AC
  Administered 2015-10-14: 500 mL via INTRAVENOUS

## 2015-10-14 MED ORDER — METRONIDAZOLE 500 MG PO TABS: 500.0000 mg | ORAL_TABLET | Freq: Three times a day (TID) | ORAL | Status: AC

## 2015-10-14 MED ORDER — DICYCLOMINE HCL 20 MG PO TABS
20.0000 mg | ORAL_TABLET | Freq: Four times a day (QID) | ORAL | Status: DC | PRN
Start: 2015-10-14 — End: 2018-10-08

## 2015-10-14 MED ORDER — CIPROFLOXACIN HCL 500 MG PO TABS: 500.0000 mg | ORAL_TABLET | Freq: Two times a day (BID) | ORAL | Status: AC

## 2015-10-14 NOTE — ED Provider Notes (Signed)
[facility] Emergency Department Provider Note  ____________________________________________  Time seen: Approximately 3:20 PM  I have reviewed the triage vital signs and the nursing notes.   HISTORY  Chief Complaint Headache and Weakness  History is limited by the patient's chronic dementia  HPI Deborah Sanders is a 82 y.o. female with a history of numerous episodes of diverticulitis diagnosed both by exam and by CT scan status post surgical bowel resection years ago and also a history of occasional headaches.  She presents with her husband with concerns about intermittent temporal headache worse on the left side that started a couple of days ago.  The patient denies any headache at this time and does not remember it, but her husband says that it was sharp, severe, acute in onset, and lasts 15-20 minutes before resolving but then occurring again later.  Nothing makes it better and nothing makes it worse.  Additionally, for the last several days the patient has not been eating or drinking very well and complaining of gradual onset worsening dull aching abdominal pain located primarily in her left lower quadrant.  This is consistent with prior episodes of colitis or diverticulitis.  That is the reason she apparently has not been eating or drinking as well.  She has had no nausea, vomiting, or diarrhea.  The husband is unaware of any fever or chills.  Both patient and husband deny any shortness of breath, cough, congestion.  She has not been urinating more than usual and she denies dysuria.   Past Medical History  Diagnosis Date  . Memory disturbance   . Hypertension   . Hypothyroidism   . Vitamin B12 deficiency   . Dyslipidemia   . History of cerebrovascular disease     TIA in the past  . GERD (gastroesophageal reflux disease)   . Osteopenia   . Vitamin D deficiency   . Colon polyps     adenomatous  . Degenerative arthritis   . Diverticulitis     with prior  surgeries  . External hemorrhoids     Patient Active Problem List   Diagnosis Date Noted  . LLQ abdominal pain 05/31/2013  . 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 RECTAL BLEEDING 03/26/2009  . HYPOTHYROIDISM 09/19/2008  . HYPERTENSION 09/19/2008  . HEMORRHOIDS, INTERNAL 09/19/2008  . EXTERNAL HEMORRHOIDS 09/19/2008  . DIVERTICULITIS, COLON 09/19/2008  . COLONIC POLYPS, HX OF 09/19/2008    Past Surgical History  Procedure Laterality Date  . Tonsillectomy    . Abdominal hysterectomy    . Breast cyst excision      Current Outpatient Rx  Name  Route  Sig  Dispense  Refill  . CALCIUM-MAGNESIUM-ZINC PO   Oral   Take 1 tablet by mouth daily.         . Coenzyme Q10 (COQ10 PO)   Oral   Take 1 capsule by mouth daily.          . diazepam (VALIUM) 5 MG tablet   Oral   Take 0.5 tablets (2.5 mg total) by mouth every 6 (six) hours as needed for anxiety.   1 tablet   0     Take half tablet 30 min before MRI. Repeat once if ...   . estradiol (ESTRACE) 0.5 MG tablet   Oral   Take 0.5 mg by mouth at bedtime.          Marland Kitchen ezetimibe-simvastatin (VYTORIN) 10-40 MG per tablet   Oral   Take  1 tablet by mouth at bedtime.         . ibandronate (BONIVA) 150 MG tablet   Oral   Take 150 mg by mouth every 30 (thirty) days. Pt takes on the 5th of every month.   Take in the morning with a full glass of water, on an empty stomach, and do not take anything else by mouth or lie down for the next 30 min.         Marland Kitchen levothyroxine (SYNTHROID, LEVOTHROID) 75 MCG tablet   Oral   Take 75 mcg by mouth at bedtime.          . memantine (NAMENDA XR) 28 MG CP24 24 hr capsule   Oral   Take 28 mg by mouth at bedtime.         . Multiple Vitamin (MULTIVITAMIN WITH MINERALS) TABS tablet   Oral   Take 1 tablet by mouth daily.         Marland Kitchen omeprazole (PRILOSEC) 20 MG capsule   Oral   Take 20 mg by mouth at bedtime.          . vitamin B-12  (CYANOCOBALAMIN) 500 MCG tablet   Oral   Take 500 mcg by mouth daily.         . ciprofloxacin (CIPRO) 500 MG tablet   Oral   Take 1 tablet (500 mg total) by mouth 2 (two) times daily.   14 tablet   0   . dicyclomine (BENTYL) 20 MG tablet   Oral   Take 1 tablet (20 mg total) by mouth every 6 (six) hours as needed (abdominal cramps).   28 tablet   0   . metroNIDAZOLE (FLAGYL) 500 MG tablet   Oral   Take 1 tablet (500 mg total) by mouth 3 (three) times daily.   21 tablet   0     Allergies Codeine; Erythromycin; and Meperidine hcl  Family History  Problem Relation Age of Onset  . Stroke Mother   . Dementia Mother   . Heart disease Father   . Dementia Maternal Aunt     Social History Social History  Substance Use Topics  . Smoking status: Former Smoker    Types: Cigarettes    Quit date: 10/13/1963  . Smokeless tobacco: None  . Alcohol Use: No     Comment: Consumes alcohol on occasion    Review of Systems (limited by the patient's dementia, provided primarily by husband) Constitutional: No fever/chills Eyes: No visual changes. ENT: No sore throat. Cardiovascular: Denies chest pain. Respiratory: Denies shortness of breath. Gastrointestinal: Gradual onset aching abdominal pain primarily in the left lower quadrant Genitourinary: Negative for dysuria. Musculoskeletal: Negative for back pain. Skin: Negative for rash. Neurological: Negative for headaches, focal weakness or numbness.  10-point ROS otherwise negative.  ____________________________________________   PHYSICAL EXAM:  VITAL SIGNS: ED Triage Vitals  Enc Vitals Group     BP 10/14/15 1453 155/94 mmHg     Pulse Rate 10/14/15 1335 72     Resp 10/14/15 1335 18     Temp 10/14/15 1335 97.7 F (36.5 C)     Temp Source 10/14/15 1335 Oral     SpO2 10/14/15 1335 97 %     Weight 10/14/15 1335 145 lb (65.772 kg)     Height 10/14/15 1335 '5\' 2"'  (1.575 m)     Head Cir --      Peak Flow --      Pain Score  --  Pain Loc --      Pain Edu? --      Excl. in [place]? --     Constitutional: Alert and oriented x 2.  Well-appearing and in no acute distress Eyes: Conjunctivae are normal. PERRL. EOMI. the patient has no tenderness to palpation of her temples and currently complains of no headache. Head: Atraumatic. Nose: No congestion/rhinnorhea. Mouth/Throat: Mucous membranes are moist.  Oropharynx non-erythematous. Neck: No stridor.   Cardiovascular: Normal rate, regular rhythm. Grossly normal heart sounds.  Good peripheral circulation. Respiratory: Normal respiratory effort.  No retractions. Lungs CTAB. Gastrointestinal: Soft with generalized tenderness to palpation that is mild all over but moderate in her left lower quadrant.  No rebound or guarding.  No pain at McBurney's point. No distention. No abdominal bruits. No CVA tenderness. Musculoskeletal: No lower extremity tenderness nor edema.  No joint effusions. Neurologic:  Normal speech and language. No gross focal neurologic deficits are appreciated.  Skin:  Skin is warm, dry and intact. No rash noted. Psychiatric: Mood and affect are normal. Speech and behavior are normal.  ____________________________________________   LABS (all labs ordered are listed, but only abnormal results are displayed)  Labs Reviewed  BASIC METABOLIC PANEL - Abnormal; Notable for the following:    Sodium 129 (*)    Chloride 95 (*)    Glucose, Bld 112 (*)    Creatinine, Ser 1.02 (*)    GFR calc non Af Amer 50 (*)    GFR calc Af Amer 58 (*)    All other components within normal limits  URINALYSIS COMPLETEWITH MICROSCOPIC ([facility] ONLY) - Abnormal; Notable for the following:    Color, Urine STRAW (*)    APPearance CLEAR (*)    Bacteria, UA RARE (*)    Squamous Epithelial / LPF 0-5 (*)    All other components within normal limits  SEDIMENTATION RATE - Abnormal; Notable for the following:    Sed Rate 71 (*)    All other components within normal limits  CBC   CBG MONITORING, ED   ____________________________________________  EKG  ED ECG REPORT I, Ioannis Schuh, the attending physician, personally viewed and interpreted this ECG.  Date: 10/14/2015 EKG Time: 13:45 Rate: 65 Rhythm: normal sinus rhythm QRS Axis: Left axis deviationIntervals: normal ST/T Wave abnormalities: normal Conduction Disutrbances: none Narrative Interpretation: unremarkable  ____________________________________________  RADIOLOGY   No results found.  ____________________________________________   PROCEDURES  Procedure(s) performed: None  Critical Care performed: No ____________________________________________   INITIAL IMPRESSION / ASSESSMENT AND PLAN / ED COURSE  Pertinent labs & imaging results that were available during my care of the patient were reviewed by me and considered in my medical decision making (see chart for details).  I had an extensive discussion with the patient's husband about her current symptoms.  He is concerned about all of the symptoms described including her decreased energy in general over the last 3 days.  However I explained that her labs are generally reassuring, her vital signs are normal, she has no headache at this time.  I offered a CT scan of her abdomen given her frequent issues with diverticulitis but I also explained that I would be comfortable treating her empirically given the relative mild presentation and her history and he prefers this; he does not feel a CT scan is necessary.  Her ESR is 71 but she has no tenderness to palpation of her temples at this time, so I do not believe her intermittent sharp stabbing headache represents temporal arteritis.  Additionally she has had no trauma and I did not do not think that she would benefit from a noncontrast head CT at this time.  The husband agrees with this as well.  Because of her decreased by mouth intake I offered to give her some IV fluids while awaiting a  urinalysis.  She can then follow up as an outpatient.  The husband understands and agrees with this plan.  ----------------------------------------- 4:57 PM on 10/14/2015 -----------------------------------------  I discussed extensively with the patient and her husband the results of her testing.  Given that she continues to have no tenderness to palpation of her temples and a normal pulse laterally in her temples, I do not believe that her headache nor her elevated ESR represent temporal arteritis.  Additionally, I believe that given her dementia, she may be particularly prone to the negative side effects of high-dose steroids if we were to treat empirically.  I recommended that he follow-up with the patient's primary care doctor at the next available opportunity to discuss whether or not they believe that additional testing is necessary, particularly if the patient's headache returns.  Described above we will continue to treat the abdominal pain empirically as diverticulitis.  I gave my usual and customary return precautions.  ____________________________________________  FINAL CLINICAL IMPRESSION(S) / ED DIAGNOSES  Final diagnoses:  LLQ abdominal pain  Nonintractable episodic headache, unspecified headache type      NEW MEDICATIONS STARTED DURING THIS VISIT:  New Prescriptions   CIPROFLOXACIN (CIPRO) 500 MG TABLET    Take 1 tablet (500 mg total) by mouth 2 (two) times daily.   DICYCLOMINE (BENTYL) 20 MG TABLET    Take 1 tablet (20 mg total) by mouth every 6 (six) hours as needed (abdominal cramps).   METRONIDAZOLE (FLAGYL) 500 MG TABLET    Take 1 tablet (500 mg total) by mouth 3 (three) times daily.    Hinda Kehr, MD 10/14/15 [phone]

## 2015-10-14 NOTE — ED Notes (Signed)
Pt presents to ER with aches and pains per husband. Also has intermittent temporal pain that started yesterday. " It is like a headache" and it can be sharp. Per husband pt has not eaten in the last three days but pt states she has when he is not there. Pt has hx of "memory issues".

## 2015-10-14 NOTE — Discharge Instructions (Signed)
We believe your symptoms are most likely caused by diverticulitis.  Most of the time this condition (please read through the included information) can be cured with outpatient antibiotics.  Please take the full course of prescribed medication(s) and follow up with the doctors recommended above.  You may also take Bentyl as needed for abdominal pain and cramping.  As we discussed, initially I was concerned that her headache may be caused by temporal arteritis.  Although her blood test (ESR) is slightly elevated, this may be the result of her abdominal issues evident that she has absolutely no tenderness to palpation of her temples at this time and no headache.  As we discussed, the treatment for temporal arteritis at least initially is high-dose steroids, and this may have negative side effects on her mental status.  We recommend that he follow up with your primary care doctor at the next available opportunity to discuss her symptoms and discuss whether additional management is warranted, such as empiric treatment or even a temporal artery biopsy if her pain comes back or worsens.  Return to the ED if your abdominal pain worsens or fails to improve, you develop bloody vomiting, bloody diarrhea, you are unable to tolerate fluids due to vomiting, fever greater than 101, or other symptoms that concern you.   Diverticulitis Diverticulitis is inflammation or infection of small pouches in your colon that form when you have a condition called diverticulosis. The pouches in your colon are called diverticula. Your colon, or large intestine, is where water is absorbed and stool is formed. Complications of diverticulitis can include:  Bleeding.  Severe infection.  Severe pain.  Perforation of your colon.  Obstruction of your colon. CAUSES  Diverticulitis is caused by bacteria. Diverticulitis happens when stool becomes trapped in diverticula. This allows bacteria to grow in the diverticula, which can lead  to inflammation and infection. RISK FACTORS People with diverticulosis are at risk for diverticulitis. Eating a diet that does not include enough fiber from fruits and vegetables may make diverticulitis more likely to develop. SYMPTOMS  Symptoms of diverticulitis may include:  Abdominal pain and tenderness. The pain is normally located on the left side of the abdomen, but may occur in other areas.  Fever and chills.  Bloating.  Cramping.  Nausea.  Vomiting.  Constipation.  Diarrhea.  Blood in your stool. DIAGNOSIS  Your health care provider will ask you about your medical history and do a physical exam. You may need to have tests done because many medical conditions can cause the same symptoms as diverticulitis. Tests may include:  Blood tests.  Urine tests.  Imaging tests of the abdomen, including X-rays and CT scans. When your condition is under control, your health care provider may recommend that you have a colonoscopy. A colonoscopy can show how severe your diverticula are and whether something else is causing your symptoms. TREATMENT  Most cases of diverticulitis are mild and can be treated at home. Treatment may include:  Taking over-the-counter pain medicines.  Following a clear liquid diet.  Taking antibiotic medicines by mouth for 7-10 days. More severe cases may be treated at a hospital. Treatment may include:  Not eating or drinking.  Taking prescription pain medicine.  Receiving antibiotic medicines through an IV tube.  Receiving fluids and nutrition through an IV tube.  Surgery. HOME CARE INSTRUCTIONS   Follow your health care provider's instructions carefully.  Follow a full liquid diet or other diet as directed by your health care provider. After your  symptoms improve, your health care provider may tell you to change your diet. He or she may recommend you eat a high-fiber diet. Fruits and vegetables are good sources of fiber. Fiber makes it  easier to pass stool.  Take fiber supplements or probiotics as directed by your health care provider.  Only take medicines as directed by your health care provider.  Keep all your follow-up appointments. SEEK MEDICAL CARE IF:   Your pain does not improve.  You have a hard time eating food.  Your bowel movements do not return to normal. SEEK IMMEDIATE MEDICAL CARE IF:   Your pain becomes worse.  Your symptoms do not get better.  Your symptoms suddenly get worse.  You have a fever.  You have repeated vomiting.  You have bloody or black, tarry stools. MAKE SURE YOU:   Understand these instructions.  Will watch your condition.  Will get help right away if you are not doing well or get worse. Document Released: 07/08/2005 Document Revised: 10/03/2013 Document Reviewed: 08/23/2013 [facility] Patient Information 2015 [place], [place]. This information is not intended to replace advice given to you by your health care provider. Make sure you discuss any questions you have with your health care provider.

## 2015-10-16 ENCOUNTER — Other Ambulatory Visit: Payer: Self-pay | Admitting: Neurology

## 2015-10-16 ENCOUNTER — Other Ambulatory Visit: Payer: Self-pay | Admitting: Internal Medicine

## 2015-10-16 DIAGNOSIS — R109 Unspecified abdominal pain: Secondary | ICD-10-CM

## 2015-10-16 MED ORDER — DIAZEPAM 5 MG PO TABS
2.5000 mg | ORAL_TABLET | Freq: Once | ORAL | Status: DC
Start: 2015-10-16 — End: 2018-10-08

## 2015-10-17 ENCOUNTER — Ambulatory Visit
Admission: RE | Admit: 2015-10-17 | Discharge: 2015-10-17 | Disposition: A | Discharge status: Home / Self Care | Payer: Medicare Other | Source: Ambulatory Visit | Attending: Internal Medicine | Admitting: Internal Medicine

## 2015-10-17 ENCOUNTER — Telehealth: Payer: Self-pay | Admitting: Neurology

## 2015-10-17 ENCOUNTER — Ambulatory Visit (HOSPITAL_COMMUNITY): Payer: Medicare Other

## 2015-10-17 DIAGNOSIS — N281 Cyst of kidney, acquired: Secondary | ICD-10-CM | POA: Diagnosis not present

## 2015-10-17 DIAGNOSIS — J189 Pneumonia, unspecified organism: Secondary | ICD-10-CM | POA: Insufficient documentation

## 2015-10-17 DIAGNOSIS — R109 Unspecified abdominal pain: Secondary | ICD-10-CM | POA: Diagnosis present

## 2015-10-17 DIAGNOSIS — K8020 Calculus of gallbladder without cholecystitis without obstruction: Secondary | ICD-10-CM | POA: Insufficient documentation

## 2015-10-17 DIAGNOSIS — K760 Fatty (change of) liver, not elsewhere classified: Secondary | ICD-10-CM | POA: Insufficient documentation

## 2015-10-17 DIAGNOSIS — Z8719 Personal history of other diseases of the digestive system: Secondary | ICD-10-CM | POA: Diagnosis present

## 2015-10-17 MED ORDER — IOHEXOL 300 MG/ML  SOLN
100.0000 mL | Freq: Once | INTRAMUSCULAR | Status: DC | PRN
Start: 2015-10-17 — End: 2015-10-18

## 2015-10-17 NOTE — Telephone Encounter (Signed)
I spoke with Remi HaggardHenry Fronczak.  He let me know that Karie SodaJane Crudup is ill (potentially diverticulitis) and that she will have a scan tomorrow (regarding her illness).  Due to this illness, Sherilyn CooterHenry requested that I cancel her upcoming MRI appointment.  He asked that I reschedule the appointment on Monday or Friday.  Sherilyn CooterHenry also requested that I mail the script for Valium to his home address. I told him that I would.

## 2015-10-18 ENCOUNTER — Other Ambulatory Visit: Payer: Medicare Other

## 2015-10-25 ENCOUNTER — Encounter (HOSPITAL_COMMUNITY)
Admission: RE | Admit: 2015-10-25 | Discharge: 2015-10-25 | Disposition: A | Discharge status: Home / Self Care | Payer: Medicare Other | Source: Ambulatory Visit | Attending: Neurology | Admitting: Neurology

## 2015-10-25 DIAGNOSIS — G301 Alzheimer's disease with late onset: Secondary | ICD-10-CM | POA: Insufficient documentation

## 2015-10-25 DIAGNOSIS — F0280 Dementia in other diseases classified elsewhere without behavioral disturbance: Secondary | ICD-10-CM | POA: Insufficient documentation

## 2015-10-28 ENCOUNTER — Telehealth: Payer: Self-pay | Admitting: Neurology

## 2015-10-28 ENCOUNTER — Ambulatory Visit
Admission: RE | Admit: 2015-10-28 | Discharge: 2015-10-28 | Disposition: A | Discharge status: Home / Self Care | Payer: No Typology Code available for payment source | Source: Ambulatory Visit | Attending: Neurology | Admitting: Neurology

## 2015-10-28 DIAGNOSIS — G301 Alzheimer's disease with late onset: Principal | ICD-10-CM

## 2015-10-28 DIAGNOSIS — F0280 Dementia in other diseases classified elsewhere without behavioral disturbance: Secondary | ICD-10-CM

## 2015-10-28 MED ORDER — ACETAMINOPHEN 500 MG PO TABS
500.0000 mg | ORAL_TABLET | Freq: Once | ORAL | Status: AC
  Administered 2015-10-28: 500 mg via ORAL

## 2015-10-28 NOTE — Telephone Encounter (Signed)
I returned a voicemail left from Phelps DodgeHenry Sanders.  He wanted to know what time Deborah GoldsmithJane Sanders's MRI appointment was for today.  I let him know that it was at 12:20pm.  He said that he would be here at 12:00pm to pick up some Valium tablets for Deborah SodaJane Sanders.

## 2015-10-29 ENCOUNTER — Telehealth: Payer: Self-pay | Admitting: Neurology

## 2015-10-29 NOTE — Telephone Encounter (Signed)
I left a message for Deborah Sanders.  I let him know that I rescheduled Terriah Reggio PET Scan for January 27th at 2:30pm.  I told him to check in at 2:00pm to [facility].  I also let him know that I was unable to schedule Mikaiah for an early morning appointment due to the fact that the facility does not receive the study drug until in the afternoon.  I said that if he had any questions, to please call me back at [phone].

## 2015-10-29 NOTE — Telephone Encounter (Signed)
Deborah Sanders called the Research Department.  I let him know that I rescheduled Deborah Sanders PET Scan for 11/08/15 at 2:30pm.  I told him to check in at 2pm.  He said that would work and asked if we could refill Deborah Sanders prescription for Valium.  I said that I would talk to my manager about that.  He requested that I mail the script to him once it is filled.  I said ok.

## 2015-11-01 ENCOUNTER — Other Ambulatory Visit: Payer: Self-pay | Admitting: Neurology

## 2015-11-01 DIAGNOSIS — F411 Generalized anxiety disorder: Secondary | ICD-10-CM

## 2015-11-01 MED ORDER — DIAZEPAM 5 MG PO TABS
10.0000 mg | ORAL_TABLET | Freq: Once | ORAL | Status: DC
Start: 2015-11-01 — End: 2015-11-04

## 2015-11-04 ENCOUNTER — Other Ambulatory Visit: Payer: Self-pay

## 2015-11-04 DIAGNOSIS — F411 Generalized anxiety disorder: Secondary | ICD-10-CM

## 2015-11-04 MED ORDER — DIAZEPAM 5 MG PO TABS: 10.0000 mg | ORAL_TABLET | Freq: Once | ORAL | Status: DC

## 2015-11-05 ENCOUNTER — Telehealth: Payer: Self-pay | Admitting: Neurology

## 2015-11-05 NOTE — Telephone Encounter (Signed)
I spoke with Deborah Sanders.  I let him know that I Deborah Sanders prescription for Valium.  I asked if he could pick it up.  He said he could.  I put the prescription in an envelope with his and Deborah Sanders's name on it.  I put it in the organizer in the  Reception area.

## 2015-11-08 ENCOUNTER — Encounter (HOSPITAL_COMMUNITY)
Admission: RE | Admit: 2015-11-08 | Discharge: 2015-11-08 | Disposition: A | Discharge status: Home / Self Care | Payer: Medicare Other | Source: Ambulatory Visit | Attending: Neurology | Admitting: Neurology

## 2015-11-08 DIAGNOSIS — G301 Alzheimer's disease with late onset: Secondary | ICD-10-CM | POA: Insufficient documentation

## 2015-11-08 DIAGNOSIS — F0280 Dementia in other diseases classified elsewhere without behavioral disturbance: Secondary | ICD-10-CM | POA: Insufficient documentation

## 2015-11-11 ENCOUNTER — Ambulatory Visit (INDEPENDENT_AMBULATORY_CARE_PROVIDER_SITE_OTHER): Payer: Self-pay | Admitting: Neurology

## 2015-11-11 ENCOUNTER — Encounter (INDEPENDENT_AMBULATORY_CARE_PROVIDER_SITE_OTHER): Payer: Self-pay | Admitting: Diagnostic Neuroimaging

## 2015-11-11 DIAGNOSIS — G309 Alzheimer's disease, unspecified: Secondary | ICD-10-CM

## 2015-11-11 DIAGNOSIS — Z0289 Encounter for other administrative examinations: Secondary | ICD-10-CM

## 2015-11-11 DIAGNOSIS — F0280 Dementia in other diseases classified elsewhere without behavioral disturbance: Secondary | ICD-10-CM

## 2015-11-11 NOTE — Progress Notes (Signed)
EXPEDITION 3  DEMENTIA STUDY END OF STUDY VISIT  Patient is seen today for the end of study visit. She is accompanied by her husband. Patient is pleasant and has no complaints. Patient's husband was also given opportunity to ask questions which were answered. They were both satisfied with her participation in the study.  P hysical Exam :  Pleasant elderly Caucasian lady who is not in distress. Afebrile. Head is nontraumatic. Neck is supple without bruit.    Cardiac exam no murmur or gallop. Lungs are clear to auscultation. Distal pulses are well felt.  Neurological Exam :  Awake alert disoriented 3. Diminished atttension, registration, recall. ( For detailed cognitive testing kindly see separate study specific testing). She has poor insight into her condition. She will follow simple commands and 1 and a few two-step commands. Fundi were not visualized. Vision acuity seems adequate. Extraocular movements are full range without nystagmus. She blinks to threat bilaterally. Not cooperative for detailed visual field testing. Face is symmetric without weakness. Tongue is midline. Cough and gag are adequate. Motor system exam revealed no upper or lower extremity drift. Symmetric and equal strength in all 4 extremities. No focal weakness. Deep tendon reflexes are 1+ symmetric. Plantars are downgoing. Sensation appears preserved bilaterally. Gait steady. Unable to walk tandem without difficulty  IMPRESSION : 82 year old pleasant Caucasian lady with Alzheimer's dementia who has successfully completed participation in the expedition 3 dementia study. PLAN : Patient was advised to continue on Namenda XR 28 mg daily for her dementia and follow-up in the clinic in 6 months or call earlier if necessary. I thankedpatient and her husband further participation in the study and contribution to dementia research  Delia Heady, MD

## 2016-09-12 IMAGING — CT CT ABD-PELV W/ CM
1 of 3 series · 14 of 32 positions shown, 19 images · IV contrast (omnipaque)
Comparison: None.

CLINICAL DATA: Diarrhea, stomach pain, and decreased appetite for 1
week. Dementia.

EXAM:
CT ABDOMEN AND PELVIS WITH CONTRAST
TECHNIQUE: Multidetector CT imaging of the abdomen and pelvis was performed
using the standard protocol following bolus administration of
intravenous contrast.
CONTRAST:  100mL OMNIPAQUE IOHEXOL 300 MG/ML  SOLN

[Series 2: routine abd pel with · axial · 0.76mm/px · z∈[-820,-410]mm · 14 of 94 slices shown, 19 images]
[im 6/94  soft-tissue]
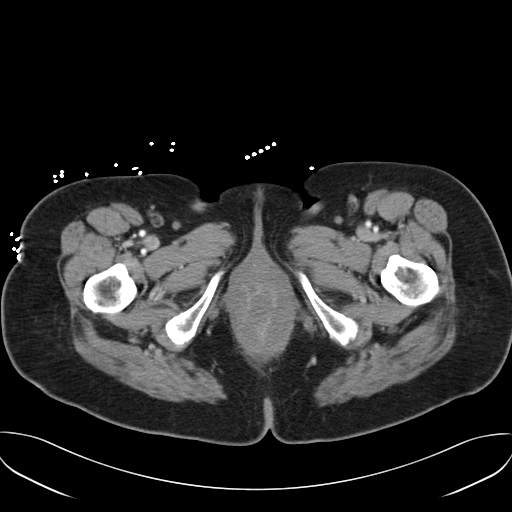
[im 6/94  bone]
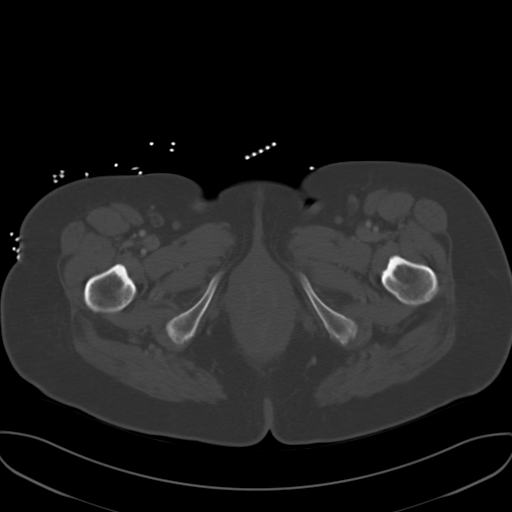
[im 11/94  soft-tissue]
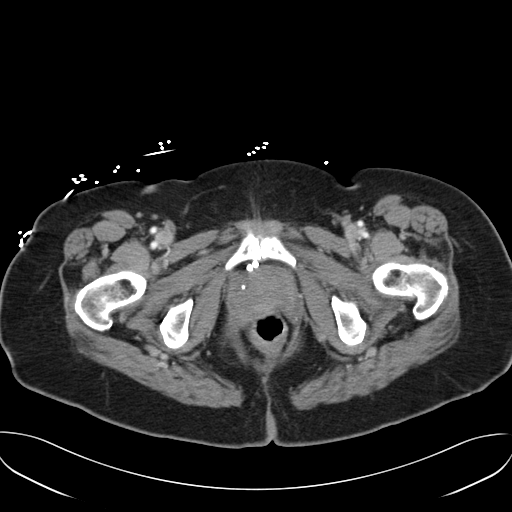
[im 21/94  soft-tissue]
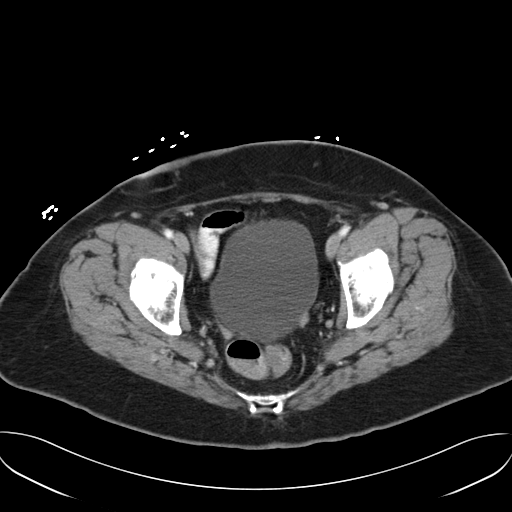
[im 26/94  soft-tissue]
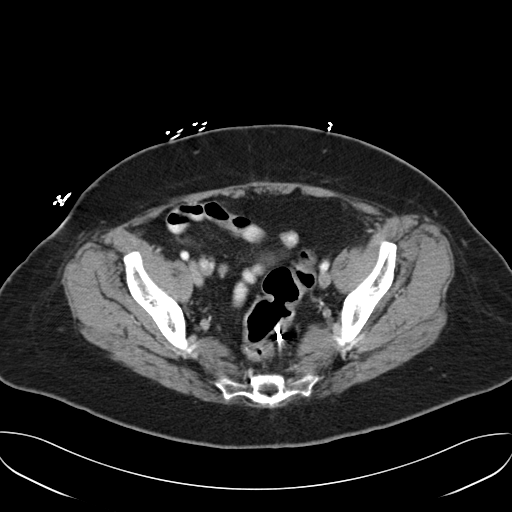
[im 32/94  soft-tissue]
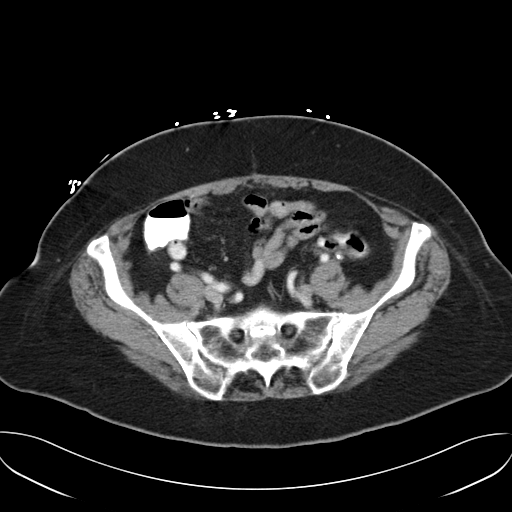
[im 42/94  soft-tissue]
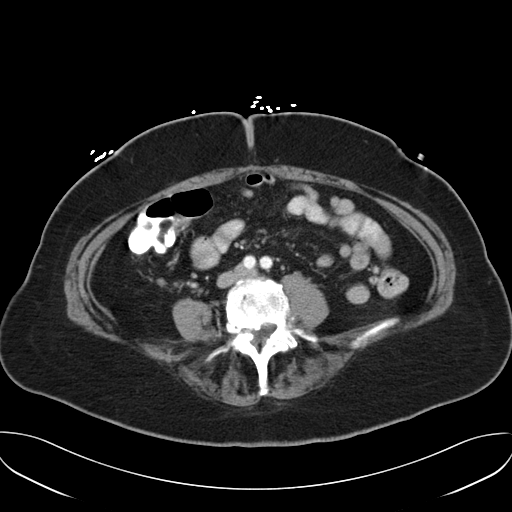
[im 47/94  soft-tissue]
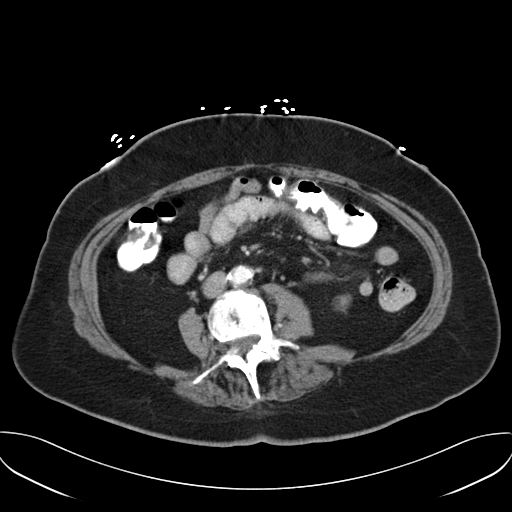
[im 52/94  soft-tissue]
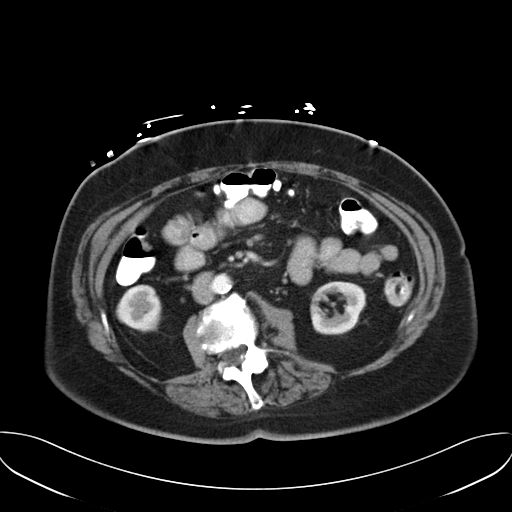
[im 63/94  soft-tissue]
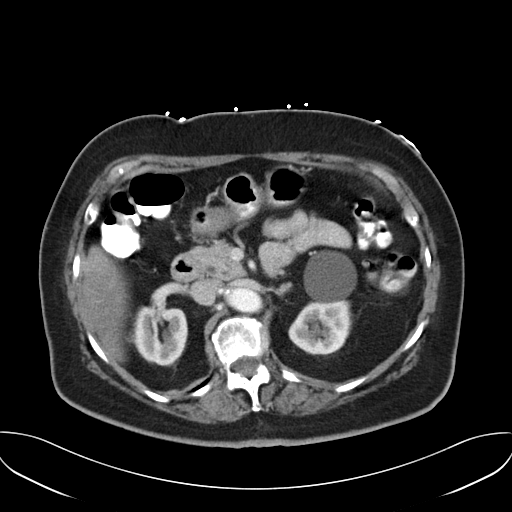
[im 63/94  bone]
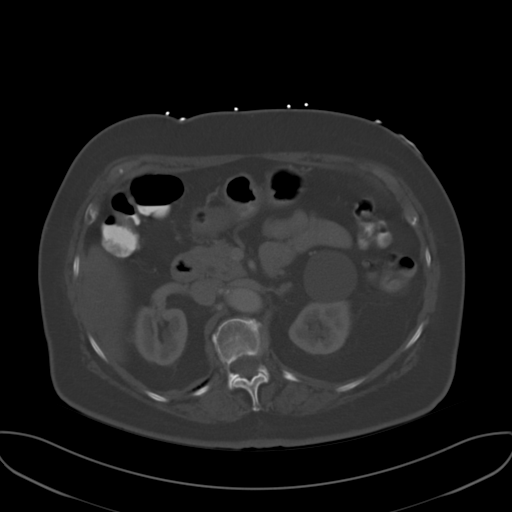
[im 68/94  soft-tissue]
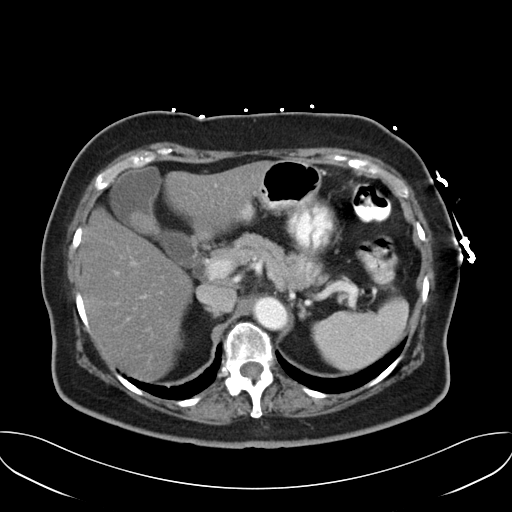
[im 73/94  soft-tissue]
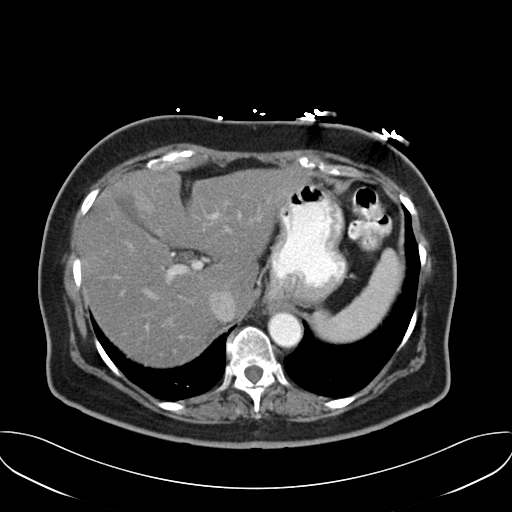
[im 73/94  lung]
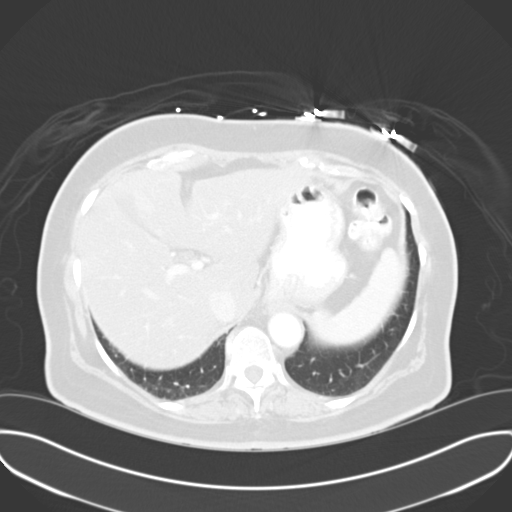
[im 78/94  lung]
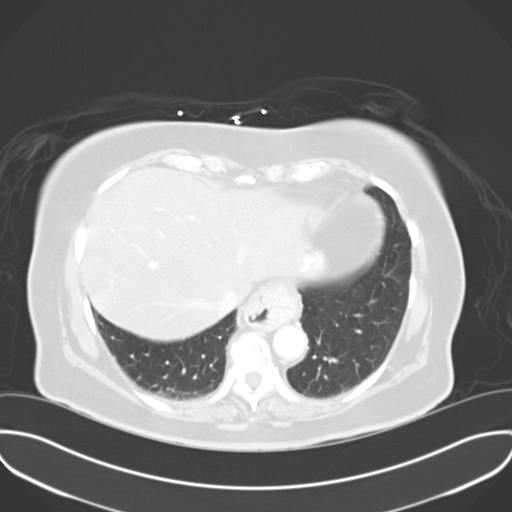
[im 83/94  soft-tissue]
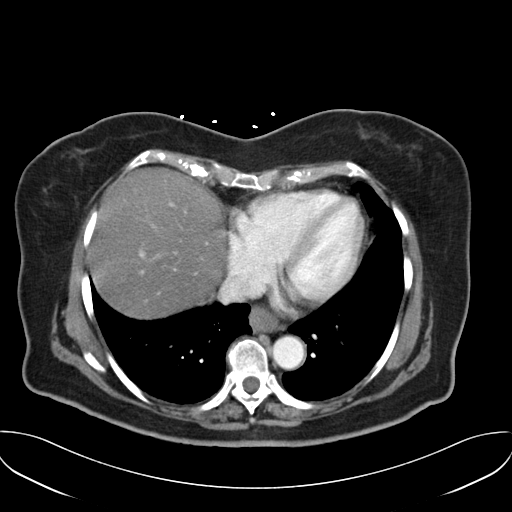
[im 83/94  lung]
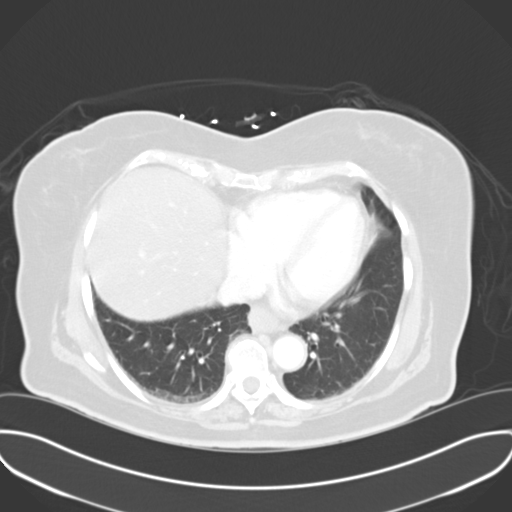
[im 88/94  soft-tissue]
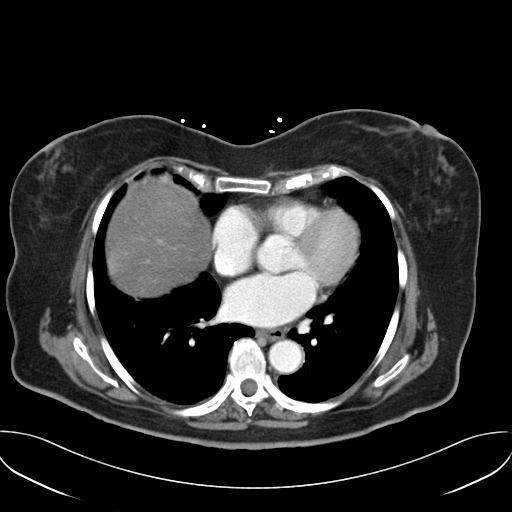
[im 88/94  lung]
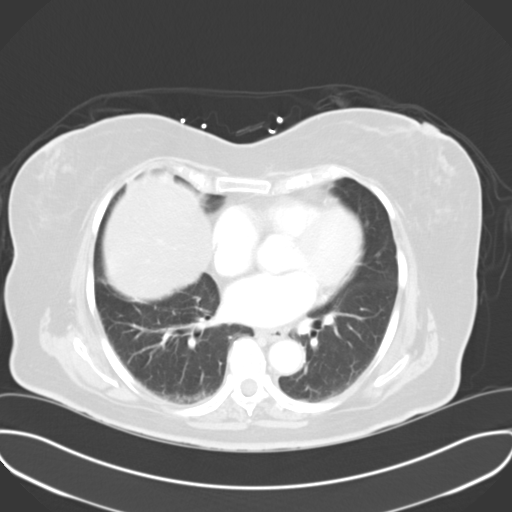

[14 of 32 positions shown; findings below may reference images not displayed]

FINDINGS: BODY WALL: Unremarkable. Previous lower abdominal anterior repair
with mesh like agent.

LOWER CHEST: Unremarkable.  Possible pectus deformity.

ABDOMEN/PELVIS:

Liver: Steatosis.

Biliary: There is a large gallstone within the gallbladder. There is
no pericholecystic fluid or wall thickening. The gallstone measures
24 x 30 mm cross-section.

Pancreas: Unremarkable.

Spleen: Unremarkable.

Adrenals: Unremarkable.

Kidneys and ureters: No hydronephrosis or stone. Uncomplicated
appearing LEFT renal cyst, midpole, roughly 4 cm in diameter.

Bladder: Unremarkable.

Reproductive: Unremarkable.

Bowel: Moderate-sized retrocardiac hiatal hernia. No obstruction or
paraesophageal component. Appendix not identified but no appendiceal
inflammation. Diverticulosis throughout the colon without signs of
diverticulitis. Surgical clip in the soft tissues to the LEFT of the
rectosigmoid junction may be from hysterectomy. Ileocecal valve
lipoma.

Retroperitoneum: No mass or adenopathy.

Peritoneum: No free fluid or gas.

Vascular: No acute abnormality.

OSSEOUS: No acute abnormalities. Marked degenerative scoliosis
dextroconvex in the upper lumbar region.
IMPRESSION: Large gallstone without evidence for cholecystitis.

Diverticulosis without signs of diverticulitis.

Hepatic steatosis.

## 2018-09-23 ENCOUNTER — Non-Acute Institutional Stay: Payer: Medicare Other | Admitting: Student

## 2018-09-23 VITALS — BP 142/100 | HR 96 | Resp 18 | Wt 114.5 lb

## 2018-09-23 DIAGNOSIS — Z515 Encounter for palliative care: Secondary | ICD-10-CM

## 2018-09-23 NOTE — Progress Notes (Signed)
[facility] Telephone: [phone] Fax: [phone]  PATIENT NAME: Deborah Sanders DOB: [birth year] MRN: [id number]  PRIMARY CARE PROVIDER: [facility]  REFERRING PROVIDER:  [facility] phone: [phone] fax: [phone]   RESPONSIBLE PARTY:  husband Arryanna Holquin [phone] or cell [phone]   ASSESSMENT: Deborah Sanders is observed lying on floor on her right side upon arrival. She is assessed by NP and facility staff. No bruising, wounds observed. ROM performed. She is assisted back to bed. She does answer some direct questions. Discussed with Deborah Sanders via phone. He is made aware of fall. We discussed her recent weight loss. NP recommends Hospice assessment due to declines/changes. Deborah Sanders agrees to assessment. Facility chart reflects Full code status; [facility] states that patient has a Living Will and she does not want any life sustaining measures. FAST score 6E.    RECOMMENDATIONS and PLAN:  1. Medical goals of therapy: patient being referred for Hospice assessment due to Abnormal weight loss. She has had >10% loss in the past 6 months. 2. Symptom management: weight loss-staff is encouraged to assist with meals, offer nutritional supplement.  3. Discharge Planning: Patient will continue to reside at [facility]. 4. Emotional/spiritual support: Discussed with Deborah Sanders and Deborah tech Deborah Sanders; staff is encouraged to call with questions.  Palliative Care to continue to follow for emotional/spiritual support, ongoing discussions trajectory of chronic disease progression, medical goals of therapy, monitor for symptoms with management, and reduce ED and hospitalizations with recommendations.  I spent 40 minutes providing this consultation,  from 11:00am to 11:40am. More than 50% of the time in this consultation was spent coordinating communication.   HISTORY OF PRESENT ILLNESS:  Deborah Sanders is a 84 y.o. year old female with multiple medical problems  including Alzheimer's dementia with behavioral disturbance, disorientation, hallucinations and hypertension. She currently resides at [facility] living on the locked memory unit. Palliative Care was asked to help address ongoing discussion of medical goals of therapy, chronic disease processes, symptom management, ongoing monitoring of stability or decline, emotional and spiritual support. Deborah Sanders has very poor appetite per staff. Deborah Sanders states that patient will sleep through some meals. Her current weight is 114.5 pounds. Weight one month ago was 123 pounds. Staff report patient sleeping throughout the day and is awake for meals. She is ambulatory without assistive device. Staff report worsening incontinent episodes. She was found on floor today; unwitnessed fall. She reports generalized pain, unable to specify location. Deborah Sanders to provide prn pain medication. She denies hitting her head.   CODE STATUS:   PPS: 40% HOSPICE ELIGIBILITY/DIAGNOSIS: YES, Abnormal Weight Loss  PAST MEDICAL HISTORY:  Past Medical History:  Diagnosis Date  . Colon polyps    adenomatous  . Degenerative arthritis   . Diverticulitis    with prior surgeries  . Dyslipidemia   . External hemorrhoids   . GERD (gastroesophageal reflux disease)   . History of cerebrovascular disease    TIA in the past  . Hypertension   . Hypothyroidism   . Memory disturbance   . Osteopenia   . Vitamin B12 deficiency   . Vitamin D deficiency     SOCIAL HX:  Social History   Tobacco Use  . Smoking status: Former Smoker    Types: Cigarettes    Last attempt to quit: 10/13/1963    Years since quitting: 54.9  Substance Use Topics  . Alcohol use: No    Comment: Consumes alcohol on occasion  ALLERGIES:  Allergies  Allergen Reactions  . Codeine Nausea And Vomiting  . Erythromycin Nausea And Vomiting  . Meperidine Hcl Nausea And Vomiting     PERTINENT MEDICATIONS:  Outpatient Encounter Medications  as of 09/23/2018  Medication Sig  . aspirin 81 MG chewable tablet Chew 81 mg by mouth daily.  Marland Kitchen. CALCIUM-MAGNESIUM-ZINC PO Take 1 tablet by mouth daily.  . carvedilol (COREG) 3.125 MG tablet Take 3.125 mg by mouth 2 (two) times daily.  Marland Kitchen. ezetimibe-simvastatin (VYTORIN) 10-40 MG per tablet Take 1 tablet by mouth at bedtime.  Marland Kitchen. LORazepam (ATIVAN) 1 MG tablet Take 1 mg by mouth every 8 (eight) hours.  . memantine (NAMENDA) 10 MG tablet Take 10 mg by mouth 2 (two) times daily.  . Multiple Vitamin (MULTIVITAMIN WITH MINERALS) TABS tablet Take 1 tablet by mouth daily.  Marland Kitchen. omeprazole (PRILOSEC) 20 MG capsule Take 20 mg by mouth at bedtime.   Marland Kitchen. QUEtiapine (SEROQUEL) 25 MG tablet Take 25 mg by mouth at bedtime.  . vitamin B-12 (CYANOCOBALAMIN) 500 MCG tablet Take 500 mcg by mouth daily.  . Coenzyme Q10 (COQ10 PO) Take 1 capsule by mouth daily.   . diazepam (VALIUM) 5 MG tablet Take 0.5 tablets (2.5 mg total) by mouth every 6 (six) hours as needed for anxiety. (Patient not taking: Reported on 09/23/2018)  . diazepam (VALIUM) 5 MG tablet Take 0.5 tablets (2.5 mg total) by mouth once. (Patient not taking: Reported on 09/23/2018)  . diazepam (VALIUM) 5 MG tablet Take 2 tablets (10 mg total) by mouth once. Take one 5 mg tab prior to PET scan and may repeat x 1 if needed (Patient not taking: Reported on 09/23/2018)  . dicyclomine (BENTYL) 20 MG tablet Take 1 tablet (20 mg total) by mouth every 6 (six) hours as needed (abdominal cramps). (Patient not taking: Reported on 09/23/2018)  . estradiol (ESTRACE) 0.5 MG tablet Take 0.5 mg by mouth at bedtime.   . ibandronate (BONIVA) 150 MG tablet Take 150 mg by mouth every 30 (thirty) days. Pt takes on the 5th of every month.   Take in the morning with a full glass of water, on an empty stomach, and do not take anything else by mouth or lie down for the next 30 min.  Marland Kitchen. levothyroxine (SYNTHROID, LEVOTHROID) 75 MCG tablet Take 75 mcg by mouth at bedtime.   . memantine  (NAMENDA XR) 28 MG CP24 24 hr capsule Take 28 mg by mouth at bedtime.   No facility-administered encounter medications on file as of 09/23/2018.     PHYSICAL EXAM:   General: NAD, frail appearing, thin Cardiovascular: regular rate and rhythm Pulmonary: clear ant fields Abdomen: soft, nontender, + bowel sounds GU: no suprapubic tenderness Extremities: trace edema to lower extremities, no joint deformities Skin: no rashes Neurological: Weakness but otherwise nonfocal  Luella CookLaToya S Machael Raine, NP

## 2018-10-08 ENCOUNTER — Emergency Department

## 2018-10-08 ENCOUNTER — Other Ambulatory Visit: Payer: Self-pay

## 2018-10-08 ENCOUNTER — Inpatient Hospital Stay
Admission: EM | Admit: 2018-10-08 | Discharge: 2018-10-09 | DRG: 536 | Disposition: A | Discharge status: Hospice | Attending: Internal Medicine | Admitting: Internal Medicine

## 2018-10-08 DIAGNOSIS — E860 Dehydration: Secondary | ICD-10-CM | POA: Diagnosis present

## 2018-10-08 DIAGNOSIS — E039 Hypothyroidism, unspecified: Secondary | ICD-10-CM | POA: Diagnosis present

## 2018-10-08 DIAGNOSIS — E785 Hyperlipidemia, unspecified: Secondary | ICD-10-CM | POA: Diagnosis present

## 2018-10-08 DIAGNOSIS — Z7982 Long term (current) use of aspirin: Secondary | ICD-10-CM | POA: Diagnosis not present

## 2018-10-08 DIAGNOSIS — Z885 Allergy status to narcotic agent status: Secondary | ICD-10-CM

## 2018-10-08 DIAGNOSIS — S72011A Unspecified intracapsular fracture of right femur, initial encounter for closed fracture: Secondary | ICD-10-CM | POA: Diagnosis not present

## 2018-10-08 DIAGNOSIS — S72001A Fracture of unspecified part of neck of right femur, initial encounter for closed fracture: Secondary | ICD-10-CM | POA: Diagnosis present

## 2018-10-08 DIAGNOSIS — Z8673 Personal history of transient ischemic attack (TIA), and cerebral infarction without residual deficits: Secondary | ICD-10-CM

## 2018-10-08 DIAGNOSIS — Z66 Do not resuscitate: Secondary | ICD-10-CM | POA: Diagnosis present

## 2018-10-08 DIAGNOSIS — Z515 Encounter for palliative care: Secondary | ICD-10-CM | POA: Diagnosis present

## 2018-10-08 DIAGNOSIS — Z888 Allergy status to other drugs, medicaments and biological substances status: Secondary | ICD-10-CM | POA: Diagnosis not present

## 2018-10-08 DIAGNOSIS — Y92129 Unspecified place in nursing home as the place of occurrence of the external cause: Secondary | ICD-10-CM

## 2018-10-08 DIAGNOSIS — Z7401 Bed confinement status: Secondary | ICD-10-CM

## 2018-10-08 DIAGNOSIS — I10 Essential (primary) hypertension: Secondary | ICD-10-CM | POA: Diagnosis present

## 2018-10-08 DIAGNOSIS — Z87891 Personal history of nicotine dependence: Secondary | ICD-10-CM

## 2018-10-08 DIAGNOSIS — Z79899 Other long term (current) drug therapy: Secondary | ICD-10-CM

## 2018-10-08 DIAGNOSIS — F0390 Unspecified dementia without behavioral disturbance: Secondary | ICD-10-CM | POA: Diagnosis present

## 2018-10-08 DIAGNOSIS — N179 Acute kidney failure, unspecified: Secondary | ICD-10-CM | POA: Diagnosis present

## 2018-10-08 DIAGNOSIS — K219 Gastro-esophageal reflux disease without esophagitis: Secondary | ICD-10-CM | POA: Diagnosis present

## 2018-10-08 DIAGNOSIS — S72009A Fracture of unspecified part of neck of unspecified femur, initial encounter for closed fracture: Secondary | ICD-10-CM

## 2018-10-08 DIAGNOSIS — W19XXXA Unspecified fall, initial encounter: Secondary | ICD-10-CM | POA: Diagnosis present

## 2018-10-08 LAB — CBC
HEMATOCRIT: 34.9 % — AB (ref 36.0–46.0)
Hemoglobin: 11.9 g/dL — ABNORMAL LOW (ref 12.0–15.0)
MCH: 34.1 pg — ABNORMAL HIGH (ref 26.0–34.0)
MCHC: 34.1 g/dL (ref 30.0–36.0)
MCV: 100 fL (ref 80.0–100.0)
Platelets: 172 10*3/uL (ref 150–400)
RBC: 3.49 MIL/uL — ABNORMAL LOW (ref 3.87–5.11)
RDW: 16.3 % — ABNORMAL HIGH (ref 11.5–15.5)
WBC: 7.1 10*3/uL (ref 4.0–10.5)
nRBC: 0 % (ref 0.0–0.2)

## 2018-10-08 LAB — BASIC METABOLIC PANEL
ANION GAP: 10 (ref 5–15)
BUN: 23 mg/dL (ref 8–23)
CO2: 23 mmol/L (ref 22–32)
Calcium: 9.2 mg/dL (ref 8.9–10.3)
Chloride: 104 mmol/L (ref 98–111)
Creatinine, Ser: 1.61 mg/dL — ABNORMAL HIGH (ref 0.44–1.00)
GFR calc Af Amer: 33 mL/min — ABNORMAL LOW (ref >60)
GFR calc non Af Amer: 29 mL/min — ABNORMAL LOW (ref >60)
Glucose, Bld: 101 mg/dL — ABNORMAL HIGH (ref 70–99)
Potassium: 3.7 mmol/L (ref 3.5–5.1)
Sodium: 137 mmol/L (ref 135–145)

## 2018-10-08 LAB — PROTIME-INR
INR: 1.2
Prothrombin Time: 15.1 seconds (ref 11.4–15.2)

## 2018-10-08 LAB — CK: Total CK: 211 U/L (ref 38–234)

## 2018-10-08 MED ORDER — MORPHINE SULFATE (PF) 2 MG/ML IV SOLN
2.0000 mg | INTRAVENOUS | Status: DC | PRN
  Administered 2018-10-08: 2 mg via INTRAVENOUS
  Filled 2018-10-08: qty 1

## 2018-10-08 MED ORDER — ONDANSETRON HCL 4 MG/2ML IJ SOLN: 4.0000 mg | Freq: Four times a day (QID) | INTRAMUSCULAR | Status: DC | PRN

## 2018-10-08 MED ORDER — SODIUM CHLORIDE 0.9 % IV SOLN: INTRAVENOUS | Status: DC

## 2018-10-08 MED ORDER — ONDANSETRON HCL 4 MG/2ML IJ SOLN
4.0000 mg | Freq: Once | INTRAMUSCULAR | Status: AC
  Administered 2018-10-08: 4 mg via INTRAVENOUS
  Filled 2018-10-08: qty 2

## 2018-10-08 MED ORDER — ACETAMINOPHEN 650 MG RE SUPP: 650.0000 mg | Freq: Four times a day (QID) | RECTAL | Status: DC | PRN

## 2018-10-08 MED ORDER — SODIUM CHLORIDE 0.9 % IV BOLUS: 500.0000 mL | Freq: Once | INTRAVENOUS | Status: DC

## 2018-10-08 MED ORDER — ONDANSETRON 4 MG PO TBDP
4.0000 mg | ORAL_TABLET | Freq: Four times a day (QID) | ORAL | Status: DC | PRN
  Filled 2018-10-08: qty 1

## 2018-10-08 MED ORDER — OXYCODONE HCL 5 MG PO TABS: 5.0000 mg | ORAL_TABLET | ORAL | Status: DC | PRN

## 2018-10-08 MED ORDER — POLYETHYLENE GLYCOL 3350 17 G PO PACK: 17.0000 g | PACK | Freq: Every day | ORAL | Status: DC | PRN

## 2018-10-08 MED ORDER — LORAZEPAM 2 MG/ML IJ SOLN: 1.0000 mg | INTRAMUSCULAR | Status: DC | PRN

## 2018-10-08 MED ORDER — ACETAMINOPHEN 325 MG PO TABS: 650.0000 mg | ORAL_TABLET | Freq: Four times a day (QID) | ORAL | Status: DC | PRN

## 2018-10-08 MED ORDER — ALBUTEROL SULFATE (2.5 MG/3ML) 0.083% IN NEBU: 2.5000 mg | INHALATION_SOLUTION | RESPIRATORY_TRACT | Status: DC | PRN

## 2018-10-08 MED ORDER — SODIUM CHLORIDE 0.9 % IV BOLUS
250.0000 mL | Freq: Once | INTRAVENOUS | Status: AC
  Administered 2018-10-08: 250 mL via INTRAVENOUS

## 2018-10-08 MED ORDER — ONDANSETRON HCL 4 MG PO TABS: 4.0000 mg | ORAL_TABLET | Freq: Four times a day (QID) | ORAL | Status: DC | PRN

## 2018-10-08 MED ORDER — CEFAZOLIN SODIUM-DEXTROSE 1-4 GM/50ML-% IV SOLN
1.0000 g | INTRAVENOUS | Status: DC
  Filled 2018-10-08: qty 50

## 2018-10-08 MED ORDER — MORPHINE SULFATE (PF) 2 MG/ML IV SOLN
2.0000 mg | INTRAVENOUS | Status: DC | PRN
  Administered 2018-10-08 – 2018-10-09 (×3): 2 mg via INTRAVENOUS
  Filled 2018-10-08 (×3): qty 1

## 2018-10-08 MED ORDER — MORPHINE SULFATE (PF) 2 MG/ML IV SOLN
1.0000 mg | INTRAVENOUS | Status: DC | PRN
  Administered 2018-10-08: 1 mg via INTRAVENOUS
  Filled 2018-10-08: qty 1

## 2018-10-08 MED ORDER — LORAZEPAM 2 MG/ML PO CONC: 1.0000 mg | ORAL | Status: DC | PRN

## 2018-10-08 MED ORDER — FENTANYL CITRATE (PF) 100 MCG/2ML IJ SOLN
25.0000 ug | Freq: Once | INTRAMUSCULAR | Status: AC
  Administered 2018-10-08: 25 ug via INTRAVENOUS
  Filled 2018-10-08: qty 2

## 2018-10-08 MED ORDER — LORAZEPAM 1 MG PO TABS: 1.0000 mg | ORAL_TABLET | ORAL | Status: DC | PRN

## 2018-10-08 MED ORDER — CLINDAMYCIN PHOSPHATE 600 MG/50ML IV SOLN
600.0000 mg | INTRAVENOUS | Status: DC
  Filled 2018-10-08: qty 50

## 2018-10-08 MED ORDER — HYDRALAZINE HCL 20 MG/ML IJ SOLN: 10.0000 mg | Freq: Four times a day (QID) | INTRAMUSCULAR | Status: DC | PRN

## 2018-10-08 MED ORDER — DOCUSATE SODIUM 100 MG PO CAPS: 100.0000 mg | ORAL_CAPSULE | Freq: Two times a day (BID) | ORAL | Status: DC

## 2018-10-08 NOTE — ED Triage Notes (Signed)
Pt presents via EMS s/p witnessed fall. Pt c/o right hip pain with shortening and rotation of right leg per EMS. Pt has hx of dementia. DNR.

## 2018-10-08 NOTE — ED Notes (Signed)
Admitting Provider at bedside. 

## 2018-10-08 NOTE — H&P (Signed)
[facility]   PATIENT NAME: Deborah Sanders    MR#:  [id number]  DATE OF BIRTH:  23-May-1931  DATE OF ADMISSION:  10/08/2018  PRIMARY CARE PHYSICIAN: [facility]   REQUESTING/REFERRING PHYSICIAN: Dr. Alphonzo Lemmings  CHIEF COMPLAINT:   Chief Complaint  Patient presents with  . Fall  . Hip Pain    HISTORY OF PRESENT ILLNESS:  Deborah Sanders  is a 84 y.o. female with a known history of Dementia, HTN, malnutrition, hypothyroidism enrolled in hospice services 1 week back presents to the hospital from memory care unit after a fall.  Patient landed on her right hip.  Found to have right femoral neck fracture.  In severe pain.  Being admitted to the hospital.  Orthopedics Dr. Hyacinth Meeker consulted.  Waiting for input. She has lost 15 pounds in the past few weeks.  Minimal oral intake per family.  Bedbound.  PAST MEDICAL HISTORY:   Past Medical History:  Diagnosis Date  . Colon polyps    adenomatous  . Degenerative arthritis   . Diverticulitis    with prior surgeries  . Dyslipidemia   . External hemorrhoids   . GERD (gastroesophageal reflux disease)   . History of cerebrovascular disease    TIA in the past  . Hypertension   . Hypothyroidism   . Memory disturbance   . Osteopenia   . Vitamin B12 deficiency   . Vitamin D deficiency     PAST SURGICAL HISTORY:   Past Surgical History:  Procedure Laterality Date  . ABDOMINAL HYSTERECTOMY    . BREAST CYST EXCISION    . TONSILLECTOMY      SOCIAL HISTORY:   Social History   Tobacco Use  . Smoking status: Former Smoker    Types: Cigarettes    Last attempt to quit: 10/13/1963    Years since quitting: 55.0  Substance Use Topics  . Alcohol use: No    Comment: Consumes alcohol on occasion    FAMILY HISTORY:   Family History  Problem Relation Age of Onset  . Stroke Mother   . Dementia Mother   . Heart disease Father   . Dementia Maternal Aunt     DRUG ALLERGIES:   Allergies   Allergen Reactions  . Codeine Nausea And Vomiting  . Erythromycin Nausea And Vomiting  . Meperidine Hcl Nausea And Vomiting    REVIEW OF SYSTEMS:   Review of Systems  Unable to perform ROS: Dementia    MEDICATIONS AT HOME:   Prior to Admission medications   Medication Sig Start Date End Date Taking? Authorizing Provider  acetaminophen (TYLENOL) 500 MG tablet Take 500 mg by mouth every 4 (four) hours as needed.   Yes [provider]  alum & mag hydroxide-simeth (MINTOX REGULAR STRENGTH) 200-200-20 MG/5ML suspension Take 30 mLs by mouth daily as needed for indigestion or heartburn.   Yes [provider]  aspirin 81 MG chewable tablet Chew 81 mg by mouth at bedtime.    Yes [provider]  bacitracin ([employer] BACITRACIN) ointment Apply 1 application topically daily. On Monday, Wednesday, and Friday   Yes [provider]  CALCIUM-MAGNESIUM-ZINC PO Take 1 tablet by mouth daily.   Yes [provider]  carvedilol (COREG) 3.125 MG tablet Take 3.125 mg by mouth 2 (two) times daily.   Yes [provider]  colchicine 0.6 MG tablet Take 0.6 mg by mouth 2 (two) times daily.   Yes [provider]  ezetimibe-simvastatin (VYTORIN) 10-40  MG per tablet Take 1 tablet by mouth daily.    Yes [provider]  guaiFENesin (ROBITUSSIN) 100 MG/5ML liquid Take 200 mg by mouth every 6 (six) hours as needed for cough.   Yes [provider]  loperamide (IMODIUM) 2 MG capsule Take 2 mg by mouth as needed for diarrhea or loose stools.   Yes [provider]  LORazepam (ATIVAN) 1 MG tablet Take 1 mg by mouth every 8 (eight) hours.   Yes [provider]  magnesium hydroxide (MILK OF MAGNESIA) 400 MG/5ML suspension Take 30 mLs by mouth daily as needed for mild constipation.   Yes [provider]  memantine (NAMENDA) 10 MG tablet Take 10 mg by mouth 2 (two) times daily.   Yes [provider]  Multiple Vitamin  (MULTIVITAMIN WITH MINERALS) TABS tablet Take 1 tablet by mouth daily.   Yes [provider]  omeprazole (PRILOSEC) 20 MG capsule Take 20 mg by mouth at bedtime.    Yes [provider]  QUEtiapine (SEROQUEL) 25 MG tablet Take 25 mg by mouth at bedtime.   Yes [provider]  senna (SENOKOT) 8.6 MG TABS tablet Take 1 tablet by mouth at bedtime.   Yes [provider]  vitamin B-12 (CYANOCOBALAMIN) 500 MCG tablet Take 500 mcg by mouth daily.   Yes [provider]     VITAL SIGNS:  Blood pressure 113/82, pulse 67, temperature (!) 95.2 F (35.1 C), temperature source Axillary, resp. rate 14, weight 54.4 kg, SpO2 100 %.  PHYSICAL EXAMINATION:  Physical Exam  GENERAL:  84 y.o.-year-old patient lying in the bed, in distress due to pain EYES: Pupils equal, round, reactive to light and accommodation. No scleral icterus. Extraocular muscles intact.  HEENT: Head atraumatic, normocephalic. Oropharynx and nasopharynx clear. No oropharyngeal erythema, moist oral mucosa  NECK:  Supple, no jugular venous distention. No thyroid enlargement, no tenderness.  LUNGS: Normal breath sounds bilaterally, no wheezing, rales, rhonchi. No use of accessory muscles of respiration.  CARDIOVASCULAR: S1, S2 normal. No murmurs, rubs, or gallops.  ABDOMEN: Soft, nontender, nondistended. Bowel sounds present. No organomegaly or mass.  EXTREMITIES: No pedal edema, cyanosis, or clubbing. + 2 pedal & radial pulses b/l.   Right lower extremity externally rotated and shortened NEUROLOGIC: Cranial nerves II through XII are intact. No focal Motor or sensory deficits appreciated b/l PSYCHIATRIC: The patient is alert and awake.  Confused  LABORATORY PANEL:   CBC Recent Labs  Lab 10/08/18 0751  WBC 7.1  HGB 11.9*  HCT 34.9*  PLT 172   ------------------------------------------------------------------------------------------------------------------  Chemistries  Recent Labs  Lab  10/08/18 0751  NA 137  K 3.7  CL 104  CO2 23  GLUCOSE 101*  BUN 23  CREATININE 1.61*  CALCIUM 9.2   ------------------------------------------------------------------------------------------------------------------  Cardiac Enzymes No results for input(s): TROPONINI in the last 168 hours. ------------------------------------------------------------------------------------------------------------------  RADIOLOGY:  Ct Head Wo Contrast  Result Date: 10/08/2018 CLINICAL DATA:  84 year old who fell and struck her head. Current history of dementia. EXAM: CT HEAD WITHOUT CONTRAST TECHNIQUE: Contiguous axial images were obtained from the base of the skull through the vertex without intravenous contrast. COMPARISON:  Brain PET-CTs and MRIs related to a research protocol from 2017 and 2016. FINDINGS: Brain: Severe cortical and deep atrophy and moderate cerebellar atrophy similar to the prior examinations. Remote RIGHT occipital lobe cortical stroke with encephalomalacia, unchanged. Moderate changes of small vessel disease of the white matter diffusely, unchanged. No mass lesion. No midline shift. No acute  hemorrhage or hematoma. No extra-axial fluid collections. No evidence of acute infarction. Vascular: Mild-to-moderate BILATERAL carotid siphon atherosclerosis. No hyperdense vessel. Skull: Mild changes of hyperostosis frontalis interna. No skull fracture or other focal osseous abnormality elsewhere involving the skull. Sinuses/Orbits: Fluid in the RIGHT MIDDLE ear cavity and in the RIGHT mastoid air cells. Visualized paranasal sinuses, LEFT mastoid air cells and LEFT MIDDLE ear cavity well aerated. Orbits and globes normal in appearance. Other: None. IMPRESSION: 1. No acute intracranial abnormality. 2. Stable severe generalized atrophy, moderate chronic microvascular ischemic changes of the white matter, and old RIGHT occipital lobe cortical stroke. 3. RIGHT MIDDLE ear cavity effusion/otitis media and  RIGHT mastoid effusion. Electronically Signed   By: Hulan Saashomas  Lawrence M.D.   On: 10/08/2018 08:25   Dg Chest Port 1 View  Result Date: 10/08/2018 CLINICAL DATA:  Former smoker. Acute RIGHT femoral neck fracture. Preoperative respiratory evaluation. EXAM: PORTABLE CHEST 1 VIEW COMPARISON:  09/18/2015, 07/09/2007. FINDINGS: Cardiac silhouette normal in size, unchanged. Stable chronic elevation of the RIGHT hemidiaphragm and chronic scar/atelectasis involving the RIGHT lung base. Lungs otherwise clear. No localized airspace consolidation. Normal bronchovascular markings. No pleural effusions. No pneumothorax. Normal pulmonary vascularity. IMPRESSION: No acute cardiopulmonary disease. Stable chronic elevation of the RIGHT hemidiaphragm and chronic scar/atelectasis involving the RIGHT lung base. Electronically Signed   By: Hulan Saashomas  Lawrence M.D.   On: 10/08/2018 09:32   Dg Hip Unilat  With Pelvis 2-3 Views Right  Result Date: 10/08/2018 CLINICAL DATA:  Right hip pain secondary to a fall. EXAM: DG HIP (WITH OR WITHOUT PELVIS) 2-3V RIGHT COMPARISON:  None. FINDINGS: There is a slightly displaced overriding right femoral neck fracture. Bones are otherwise normal. IMPRESSION: Right femoral neck fracture. Electronically Signed   By: Francene BoyersJames  Maxwell M.D.   On: 10/08/2018 08:33     IMPRESSION AND PLAN:   *Right femoral neck fracture. Patient under hospice and bedbound.  Minimal oral intake for the past few weeks per family.  Will need to wait for orthopedic surgery to see if she has any minimally invasive options.  *Hypertension.  Hold oral medications.  Hydralazine IV ordered  *Acute kidney injury.  Creatinine 1.6.  Baseline 0.8.  Will start IV fluids.  Likely due to decreased oral intake.  *Hypothyroidism.  Continue home medications.  *Dementia.  Monitor for inpatient delirium.  *DVT prophylaxis.  Lovenox to be ordered by orthopedic team as per surgical plans   All the records are reviewed and case  discussed with ED provider. Management plans discussed with the patient, family and they are in agreement.  CODE STATUS: DO NOT RESUSCITATE  TOTAL TIME TAKING CARE OF THIS PATIENT: 35 minutes.   Orie FishermanSrikar R Cicero Noy M.D on 10/08/2018 at 9:41 AM  Between 7am to 6pm - Pager - [phone]  After 6pm go to [email or web address] - password [email or web address]  [facility]  Office  [phone]  CC: Primary care physician; [facility]  Note: This dictation was prepared with Dragon dictation along with smaller phrase technology. Any transcriptional errors that result from this process are unintentional.

## 2018-10-08 NOTE — ED Notes (Signed)
Patient transported to CT 

## 2018-10-08 NOTE — Consult Note (Signed)
ORTHOPAEDIC CONSULTATION  REQUESTING PHYSICIAN: Milagros LollSudini, Srikar, MD  Chief Complaint: Right hip pain  HPI: Deborah Sanders is a 84 y.o. female who complains of right hip pain after a fall at her nursing facility today.  Patient has advanced dementia and has not been eating or drinking much in the last couple weeks.  She has lost about 15 pounds in the last month or 2.  She was brought to the emergency room where exam and x-rays revealed a displaced subcapital fracture of the right hip.  The patient is not conversant and does not recognize people.  She is walked minimally.  I have discussed treatment options with the patient's husband and multiple family members.  The risks and benefits of surgery and postop treatment were discussed at length.  It is my opinion that the patient would not benefit significantly from any surgical procedure at this point in time.  Explained to the family that surgery could be done to decrease pain but that with her limited oral intake survival would be limited.  I have discussed this among themselves and have come to the conclusion surgery would not be beneficial.  She is already being seen by hospice and I have recommended that they continue with this involvement.  Past Medical History:  Diagnosis Date  . Colon polyps    adenomatous  . Degenerative arthritis   . Diverticulitis    with prior surgeries  . Dyslipidemia   . External hemorrhoids   . GERD (gastroesophageal reflux disease)   . History of cerebrovascular disease    TIA in the past  . Hypertension   . Hypothyroidism   . Memory disturbance   . Osteopenia   . Vitamin B12 deficiency   . Vitamin D deficiency    Past Surgical History:  Procedure Laterality Date  . ABDOMINAL HYSTERECTOMY    . BREAST CYST EXCISION    . TONSILLECTOMY     Social History   Socioeconomic History  . Marital status: Married    Spouse name: Not on file  . Number of children: 3  . Years of education: 9116  . Highest  education level: Not on file  Occupational History  . Occupation: Retired    Associate Professormployer: RETIRED  Social Needs  . Financial resource strain: Not on file  . Food insecurity:    Worry: Not on file    Inability: Not on file  . Transportation needs:    Medical: Not on file    Non-medical: Not on file  Tobacco Use  . Smoking status: Former Smoker    Types: Cigarettes    Last attempt to quit: 10/13/1963    Years since quitting: 55.0  Substance and Sexual Activity  . Alcohol use: No    Comment: Consumes alcohol on occasion  . Drug use: No  . Sexual activity: Not on file  Lifestyle  . Physical activity:    Days per week: Not on file    Minutes per session: Not on file  . Stress: Not on file  Relationships  . Social connections:    Talks on phone: Not on file    Gets together: Not on file    Attends religious service: Not on file    Active member of club or organization: Not on file    Attends meetings of clubs or organizations: Not on file    Relationship status: Not on file  Other Topics Concern  . Not on file  Social History Narrative  . Not on  file   Family History  Problem Relation Age of Onset  . Stroke Mother   . Dementia Mother   . Heart disease Father   . Dementia Maternal Aunt    Allergies  Allergen Reactions  . Codeine Nausea And Vomiting  . Erythromycin Nausea And Vomiting  . Meperidine Hcl Nausea And Vomiting   Prior to Admission medications   Medication Sig Start Date End Date Taking? Authorizing Provider  acetaminophen (TYLENOL) 500 MG tablet Take 500 mg by mouth every 4 (four) hours as needed.   Yes [provider]  alum & mag hydroxide-simeth (MINTOX REGULAR STRENGTH) 200-200-20 MG/5ML suspension Take 30 mLs by mouth daily as needed for indigestion or heartburn.   Yes [provider]  aspirin 81 MG chewable tablet Chew 81 mg by mouth at bedtime.    Yes [provider]  bacitracin ([employer] BACITRACIN) ointment Apply 1 application  topically daily. On Monday, Wednesday, and Friday   Yes [provider]  CALCIUM-MAGNESIUM-ZINC PO Take 1 tablet by mouth daily.   Yes [provider]  carvedilol (COREG) 3.125 MG tablet Take 3.125 mg by mouth 2 (two) times daily.   Yes [provider]  colchicine 0.6 MG tablet Take 0.6 mg by mouth 2 (two) times daily.   Yes [provider]  ezetimibe-simvastatin (VYTORIN) 10-40 MG per tablet Take 1 tablet by mouth daily.    Yes [provider]  guaiFENesin (ROBITUSSIN) 100 MG/5ML liquid Take 200 mg by mouth every 6 (six) hours as needed for cough.   Yes [provider]  loperamide (IMODIUM) 2 MG capsule Take 2 mg by mouth as needed for diarrhea or loose stools.   Yes [provider]  LORazepam (ATIVAN) 1 MG tablet Take 1 mg by mouth every 8 (eight) hours.   Yes [provider]  magnesium hydroxide (MILK OF MAGNESIA) 400 MG/5ML suspension Take 30 mLs by mouth daily as needed for mild constipation.   Yes [provider]  memantine (NAMENDA) 10 MG tablet Take 10 mg by mouth 2 (two) times daily.   Yes [provider]  Multiple Vitamin (MULTIVITAMIN WITH MINERALS) TABS tablet Take 1 tablet by mouth daily.   Yes [provider]  omeprazole (PRILOSEC) 20 MG capsule Take 20 mg by mouth at bedtime.    Yes [provider]  QUEtiapine (SEROQUEL) 25 MG tablet Take 25 mg by mouth at bedtime.   Yes [provider]  senna (SENOKOT) 8.6 MG TABS tablet Take 1 tablet by mouth at bedtime.   Yes [provider]  vitamin B-12 (CYANOCOBALAMIN) 500 MCG tablet Take 500 mcg by mouth daily.   Yes [provider]   Ct Head Wo Contrast  Result Date: 10/08/2018 CLINICAL DATA:  84 year old who fell and struck her head. Current history of dementia. EXAM: CT HEAD WITHOUT CONTRAST TECHNIQUE: Contiguous axial images were obtained from the base of the skull through the vertex without intravenous  contrast. COMPARISON:  Brain PET-CTs and MRIs related to a research protocol from 2017 and 2016. FINDINGS: Brain: Severe cortical and deep atrophy and moderate cerebellar atrophy similar to the prior examinations. Remote RIGHT occipital lobe cortical stroke with encephalomalacia, unchanged. Moderate changes of small vessel disease of the white matter diffusely, unchanged. No mass lesion. No midline shift. No acute hemorrhage or hematoma. No extra-axial fluid collections. No evidence of acute infarction. Vascular: Mild-to-moderate BILATERAL carotid siphon atherosclerosis. No hyperdense vessel. Skull: Mild changes of hyperostosis frontalis interna. No skull fracture or  other focal osseous abnormality elsewhere involving the skull. Sinuses/Orbits: Fluid in the RIGHT MIDDLE ear cavity and in the RIGHT mastoid air cells. Visualized paranasal sinuses, LEFT mastoid air cells and LEFT MIDDLE ear cavity well aerated. Orbits and globes normal in appearance. Other: None. IMPRESSION: 1. No acute intracranial abnormality. 2. Stable severe generalized atrophy, moderate chronic microvascular ischemic changes of the white matter, and old RIGHT occipital lobe cortical stroke. 3. RIGHT MIDDLE ear cavity effusion/otitis media and RIGHT mastoid effusion. Electronically Signed   By: Hulan Saashomas  Lawrence M.D.   On: 10/08/2018 08:25   Dg Chest Port 1 View  Result Date: 10/08/2018 CLINICAL DATA:  Former smoker. Acute RIGHT femoral neck fracture. Preoperative respiratory evaluation. EXAM: PORTABLE CHEST 1 VIEW COMPARISON:  09/18/2015, 07/09/2007. FINDINGS: Cardiac silhouette normal in size, unchanged. Stable chronic elevation of the RIGHT hemidiaphragm and chronic scar/atelectasis involving the RIGHT lung base. Lungs otherwise clear. No localized airspace consolidation. Normal bronchovascular markings. No pleural effusions. No pneumothorax. Normal pulmonary vascularity. IMPRESSION: No acute cardiopulmonary disease. Stable chronic elevation  of the RIGHT hemidiaphragm and chronic scar/atelectasis involving the RIGHT lung base. Electronically Signed   By: Hulan Saashomas  Lawrence M.D.   On: 10/08/2018 09:32   Dg Hip Unilat  With Pelvis 2-3 Views Right  Result Date: 10/08/2018 CLINICAL DATA:  Right hip pain secondary to a fall. EXAM: DG HIP (WITH OR WITHOUT PELVIS) 2-3V RIGHT COMPARISON:  None. FINDINGS: There is a slightly displaced overriding right femoral neck fracture. Bones are otherwise normal. IMPRESSION: Right femoral neck fracture. Electronically Signed   By: Francene BoyersJames  Maxwell M.D.   On: 10/08/2018 08:33    Positive ROS: All other systems have been reviewed and were otherwise negative with the exception of those mentioned in the HPI and as above.  Physical Exam: General: Alert, no acute distress Cardiovascular: No pedal edema Respiratory: No cyanosis, no use of accessory musculature GI: No organomegaly, abdomen is soft and non-tender Skin: No lesions in the area of chief complaint Neurologic: Sensation intact distally Psychiatric: Patient is competent for consent with normal mood and affect Lymphatic: No axillary or cervical lymphadenopathy  MUSCULOSKELETAL: The patient is awake but nonconversant.  The right leg is shortened and rotated.  She exhibits pain with rotation of the hip.  Assessment: Displaced subcapital fracture right hip Advanced dementia  Plan: I have recommended comfort care for the patient.  Hospice is already involved with her and should be contacted again.    Valinda HoarMILLER,Janavia Rottman E, MD [phone]   10/08/2018 12:27 PM

## 2018-10-08 NOTE — Progress Notes (Signed)
Advance care planning  Purpose of Encounter Right femoral neck fracture  Parties in Attendance Patient, 2 sons and daughter-in-law  Patients Decisional capacity Patient with advanced dementia and unable to make medical decisions.  Documented healthcare power of attorney is her son. Advance directives in place.  Discussed regarding right femoral neck fracture.  Family is waiting for orthopedic input at this time regarding surgical options.  Poor long-term prognosis.  CODE STATUS DO NOT RESUSCITATE DO NOT INTUBATE.  Under hospice care.  CODE STATUS changed.  Orders entered  Time spent - 20 minutes

## 2018-10-08 NOTE — ED Provider Notes (Addendum)
[facility] Emergency Department Provider Note  ____________________________________________   I have reviewed the triage vital signs and the nursing notes. Where available I have reviewed prior notes and, if possible and indicated, outside hospital notes.    HISTORY  Chief Complaint Fall and Hip Pain    HPI Deborah Sanders is a 84 y.o. female who presents today after a fall.  The patient has a DNR/DNI form, and suffers from severe dementia is at her baseline according to EMS.  Unknown details of the fall.  Level 5 chart caveat; no further history available due to patient status.  Patient is complaining of right hip pain when you touch it according to EMS. Right leg is foreshortened and turned   Past Medical History:  Diagnosis Date  . Colon polyps    adenomatous  . Degenerative arthritis   . Diverticulitis    with prior surgeries  . Dyslipidemia   . External hemorrhoids   . GERD (gastroesophageal reflux disease)   . History of cerebrovascular disease    TIA in the past  . Hypertension   . Hypothyroidism   . Memory disturbance   . Osteopenia   . Vitamin B12 deficiency   . Vitamin D deficiency     Patient Active Problem List   Diagnosis Date Noted  . LLQ abdominal pain 05/31/2013  . 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 RECTAL BLEEDING 03/26/2009  . HYPOTHYROIDISM 09/19/2008  . HYPERTENSION 09/19/2008  . HEMORRHOIDS, INTERNAL 09/19/2008  . EXTERNAL HEMORRHOIDS 09/19/2008  . DIVERTICULITIS, COLON 09/19/2008  . COLONIC POLYPS, HX OF 09/19/2008    Past Surgical History:  Procedure Laterality Date  . ABDOMINAL HYSTERECTOMY    . BREAST CYST EXCISION    . TONSILLECTOMY      Prior to Admission medications   Medication Sig Start Date End Date Taking? Authorizing Provider  aspirin 81 MG chewable tablet Chew 81 mg by mouth daily.    [provider]  CALCIUM-MAGNESIUM-ZINC PO Take 1  tablet by mouth daily.    [provider]  carvedilol (COREG) 3.125 MG tablet Take 3.125 mg by mouth 2 (two) times daily.    [provider]  Coenzyme Q10 (COQ10 PO) Take 1 capsule by mouth daily.     [provider]  diazepam (VALIUM) 5 MG tablet Take 0.5 tablets (2.5 mg total) by mouth every 6 (six) hours as needed for anxiety. Patient not taking: Reported on 09/23/2018 07/01/15   Marvel PlanXu, Jindong, MD  diazepam (VALIUM) 5 MG tablet Take 0.5 tablets (2.5 mg total) by mouth once. Patient not taking: Reported on 09/23/2018 10/16/15   Micki RileySethi, Pramod S, MD  diazepam (VALIUM) 5 MG tablet Take 2 tablets (10 mg total) by mouth once. Take one 5 mg tab prior to PET scan and may repeat x 1 if needed Patient not taking: Reported on 09/23/2018 11/04/15   Micki RileySethi, Pramod S, MD  dicyclomine (BENTYL) 20 MG tablet Take 1 tablet (20 mg total) by mouth every 6 (six) hours as needed (abdominal cramps). Patient not taking: Reported on 09/23/2018 10/14/15   Loleta RoseForbach, Cory, MD  estradiol (ESTRACE) 0.5 MG tablet Take 0.5 mg by mouth at bedtime.     [provider]  ezetimibe-simvastatin (VYTORIN) 10-40 MG per tablet Take 1 tablet by mouth at bedtime.    [provider]  ibandronate (BONIVA) 150 MG tablet Take 150 mg by mouth every 30 (thirty) days. Pt takes on the 5th of every month.  Take in the morning with a full glass of water, on an empty stomach, and do not take anything else by mouth or lie down for the next 30 min.    [provider]  levothyroxine (SYNTHROID, LEVOTHROID) 75 MCG tablet Take 75 mcg by mouth at bedtime.     [provider]  LORazepam (ATIVAN) 1 MG tablet Take 1 mg by mouth every 8 (eight) hours.    [provider]  memantine (NAMENDA XR) 28 MG CP24 24 hr capsule Take 28 mg by mouth at bedtime.    [provider]  memantine (NAMENDA) 10 MG tablet Take 10 mg by mouth 2 (two) times daily.    [provider]  Multiple Vitamin  (MULTIVITAMIN WITH MINERALS) TABS tablet Take 1 tablet by mouth daily.    [provider]  omeprazole (PRILOSEC) 20 MG capsule Take 20 mg by mouth at bedtime.     [provider]  QUEtiapine (SEROQUEL) 25 MG tablet Take 25 mg by mouth at bedtime.    [provider]  vitamin B-12 (CYANOCOBALAMIN) 500 MCG tablet Take 500 mcg by mouth daily.    [provider]    Allergies Codeine; Erythromycin; and Meperidine hcl  Family History  Problem Relation Age of Onset  . Stroke Mother   . Dementia Mother   . Heart disease Father   . Dementia Maternal Aunt     Social History Social History   Tobacco Use  . Smoking status: Former Smoker    Types: Cigarettes    Last attempt to quit: 10/13/1963    Years since quitting: 55.0  Substance Use Topics  . Alcohol use: No    Comment: Consumes alcohol on occasion  . Drug use: No    Review of Systems Level 5 chart caveat; no further history available due to patient status.  ____________________________________________   PHYSICAL EXAM:  VITAL SIGNS: ED Triage Vitals  Enc Vitals Group     BP 10/08/18 0722 113/82     Pulse Rate 10/08/18 0722 67     Resp 10/08/18 0722 14     Temp 10/08/18 0722 (!) 95.2 F (35.1 C)     Temp Source 10/08/18 0722 Axillary     SpO2 10/08/18 0722 100 %     Weight 10/08/18 0728 120 lb (54.4 kg)     Height --      Head Circumference --      Peak Flow --      Pain Score --      Pain Loc --      Pain Edu? --      Excl. in GC? --     Constitutional: Alert and no acute distress Eyes: Conjunctivae are normal Head: Atraumatic HEENT: No congestion/rhinnorhea. Neck:   Nontender with no meningismus, no masses, no stridor Cardiovascular: Normal rate, regular rhythm. Grossly normal heart sounds.  Good peripheral circulation. Respiratory: Normal respiratory effort.  No retractions. Lungs CTAB. Abdominal: Soft and nontender. No distention. No guarding no rebound Back:  There is no  focal tenderness or step off.  there is no midline tenderness there are no lesions noted. there is no CVA tenderness Musculoskeletal: There is tenderness to palpation to the right hip, also I try to range that hip it hurts.  There is some foreshortening and the leg is turned outward. No joint effusions, no DVT signs strong distal pulses no edema Neurologic: Limited exam patient not particularly compliant does not seem to have any obvious significant  focality that I can determine with outpatient compliance Skin:  Skin is warm, dry and intact. No rash noted.   ____________________________________________   LABS (all labs ordered are listed, but only abnormal results are displayed)  Labs Reviewed  PROTIME-INR  CBC  BASIC METABOLIC PANEL    Pertinent labs  results that were available during my care of the patient were reviewed by me and considered in my medical decision making (see chart for details). ____________________________________________  EKG  I personally interpreted any EKGs ordered by me or triage Him with limited EKG, due to baseline artifact, rate is 65, likely sinus rhythm, borderline LAD no acute ischemia ____________________________________________  RADIOLOGY  Pertinent labs & imaging results that were available during my care of the patient were reviewed by me and considered in my medical decision making (see chart for details). If possible, patient and/or family made aware of any abnormal findings.  No results found. ____________________________________________    PROCEDURES  Procedure(s) performed: None  Procedures  Critical Care performed: None  ____________________________________________   INITIAL IMPRESSION / ASSESSMENT AND PLAN / ED COURSE  Pertinent labs & imaging results that were available during my care of the patient were reviewed by me and considered in my medical decision making (see chart for details).  She is suffers from significant  dementia, who is DNR/DNI, presents with right hip tenderness after a fall.  Most likely it is broken will obtain x-rays of that, obtain a CT of her head as I cannot determine if she is off her baseline, and we will likely have to admit her.  I am giving her pain control via fentanyl she is not demonstrating evidence of being uncomfortable unless the extremity is moved  ----------------------------------------- 8:59 AM on 10/08/2018 -----------------------------------------  Does have a hip fracture I discussed with the patient's family member and Dr. Hyacinth Meeker, they will talk about whether they want to get surgery.  Patient will be admitted to the hospital service I have talked to them as well   ____________________________________________   FINAL CLINICAL IMPRESSION(S) / ED DIAGNOSES  Final diagnoses:  None      This chart was dictated using voice recognition software.  Despite best efforts to proofread,  errors can occur which can change meaning.      Jeanmarie Plant, MD 10/08/18 1610    Jeanmarie Plant, MD 10/08/18 [phone]

## 2018-10-09 NOTE — Discharge Summary (Signed)
[facility] [place] at [facility]   PATIENT NAME: Deborah Sanders    MR#:  [id number]  DATE OF BIRTH:  1931-06-10  DATE OF ADMISSION:  10/08/2018 ADMITTING PHYSICIAN: Milagros Loll, MD  DATE OF DISCHARGE: 10/09/2018  PRIMARY CARE PHYSICIAN: [facility]   ADMISSION DIAGNOSIS:  Hip fracture (HCC) [S72.009A] Closed fracture of right hip, initial encounter (HCC) [S72.001A]  DISCHARGE DIAGNOSIS:  Active Problems:   Closed displaced fracture of right femoral neck (HCC)   SECONDARY DIAGNOSIS:   Past Medical History:  Diagnosis Date  . Colon polyps    adenomatous  . Degenerative arthritis   . Diverticulitis    with prior surgeries  . Dyslipidemia   . External hemorrhoids   . GERD (gastroesophageal reflux disease)   . History of cerebrovascular disease    TIA in the past  . Hypertension   . Hypothyroidism   . Memory disturbance   . Osteopenia   . Vitamin B12 deficiency   . Vitamin D deficiency      ADMITTING HISTORY  HISTORY OF PRESENT ILLNESS:  Deborah Sanders  is a 84 y.o. female with a known history of Dementia, HTN, malnutrition, hypothyroidism enrolled in hospice services 1 week back presents to the hospital from memory care unit after a fall.  Patient landed on her right hip.  Found to have right femoral neck fracture.  In severe pain.  Being admitted to the hospital.  Orthopedics Dr. Hyacinth Meeker consulted.  Waiting for input. She has lost 15 pounds in the past few weeks.  Minimal oral intake per family.  Bedbound.   HOSPITAL COURSE:   *Right femoral neck fracture. * HTN * Acute kidney injury * Hypothyroidism * Dementia * Severe protein calorie malnutirion * Dehydration  Patient admitted to medical floor for pain control. Orthopedics was consulted and advised against surgery as she has been declining significantly.  Family requesting hospice home transfer. Not eating or drinking. Life expectancy < 2 weeks.  Transfer to hospice  home  CONSULTS OBTAINED:  Treatment Team:  Deeann Saint, MD  DRUG ALLERGIES:   Allergies  Allergen Reactions  . Codeine Nausea And Vomiting  . Erythromycin Nausea And Vomiting  . Meperidine Hcl Nausea And Vomiting    DISCHARGE MEDICATIONS:   Allergies as of 10/09/2018      Reactions   Codeine Nausea And Vomiting   Erythromycin Nausea And Vomiting   Meperidine Hcl Nausea And Vomiting      Medication List    STOP taking these medications   acetaminophen 500 MG tablet Commonly known as:  TYLENOL   aspirin 81 MG chewable tablet   CALCIUM-MAGNESIUM-ZINC PO   carvedilol 3.125 MG tablet Commonly known as:  COREG   colchicine 0.6 MG tablet   [employer] BACITRACIN ointment Generic drug:  bacitracin   ezetimibe-simvastatin 10-40 MG tablet Commonly known as:  VYTORIN   guaiFENesin 100 MG/5ML liquid Commonly known as:  ROBITUSSIN   loperamide 2 MG capsule Commonly known as:  IMODIUM   magnesium hydroxide 400 MG/5ML suspension Commonly known as:  MILK OF MAGNESIA   memantine 10 MG tablet Commonly known as:  NAMENDA   MINTOX REGULAR STRENGTH 200-200-20 MG/5ML suspension Generic drug:  alum & mag hydroxide-simeth   multivitamin with minerals Tabs tablet   omeprazole 20 MG capsule Commonly known as:  PRILOSEC   QUEtiapine 25 MG tablet Commonly known as:  SEROQUEL   senna 8.6 MG Tabs tablet Commonly known as:  SENOKOT   vitamin B-12 500 MCG  tablet Commonly known as:  CYANOCOBALAMIN     TAKE these medications   LORazepam 1 MG tablet Commonly known as:  ATIVAN Take 1 mg by mouth every 8 (eight) hours.       Today   VITAL SIGNS:  Blood pressure 100/67, pulse (!) 59, temperature 99 F (37.2 C), temperature source Oral, resp. rate 14, weight 53.2 kg, SpO2 100 %.  I/O:    Intake/Output Summary (Last 24 hours) at 10/09/2018 0834 Last data filed at 10/09/2018 [id number] Gross per 24 hour  Intake -  Output 150 ml  Net -150 ml    PHYSICAL EXAMINATION:   Physical Exam  GENERAL:  84 y.o.-year-old patient lying in the bed. Wakes up with tactile stimulus PSYCHIATRIC: The patient is drowzy  DATA REVIEW:   CBC Recent Labs  Lab 10/08/18 0751  WBC 7.1  HGB 11.9*  HCT 34.9*  PLT 172    Chemistries  Recent Labs  Lab 10/08/18 0751  NA 137  K 3.7  CL 104  CO2 23  GLUCOSE 101*  BUN 23  CREATININE 1.61*  CALCIUM 9.2    Cardiac Enzymes No results for input(s): TROPONINI in the last 168 hours.  Microbiology Results  No results found for this or any previous visit.  RADIOLOGY:  Ct Head Wo Contrast  Result Date: 10/08/2018 CLINICAL DATA:  84 year old who fell and struck her head. Current history of dementia. EXAM: CT HEAD WITHOUT CONTRAST TECHNIQUE: Contiguous axial images were obtained from the base of the skull through the vertex without intravenous contrast. COMPARISON:  Brain PET-CTs and MRIs related to a research protocol from 2017 and 2016. FINDINGS: Brain: Severe cortical and deep atrophy and moderate cerebellar atrophy similar to the prior examinations. Remote RIGHT occipital lobe cortical stroke with encephalomalacia, unchanged. Moderate changes of small vessel disease of the white matter diffusely, unchanged. No mass lesion. No midline shift. No acute hemorrhage or hematoma. No extra-axial fluid collections. No evidence of acute infarction. Vascular: Mild-to-moderate BILATERAL carotid siphon atherosclerosis. No hyperdense vessel. Skull: Mild changes of hyperostosis frontalis interna. No skull fracture or other focal osseous abnormality elsewhere involving the skull. Sinuses/Orbits: Fluid in the RIGHT MIDDLE ear cavity and in the RIGHT mastoid air cells. Visualized paranasal sinuses, LEFT mastoid air cells and LEFT MIDDLE ear cavity well aerated. Orbits and globes normal in appearance. Other: None. IMPRESSION: 1. No acute intracranial abnormality. 2. Stable severe generalized atrophy, moderate chronic microvascular ischemic changes  of the white matter, and old RIGHT occipital lobe cortical stroke. 3. RIGHT MIDDLE ear cavity effusion/otitis media and RIGHT mastoid effusion. Electronically Signed   By: Hulan Saashomas  Lawrence M.D.   On: 10/08/2018 08:25   Dg Chest Port 1 View  Result Date: 10/08/2018 CLINICAL DATA:  Former smoker. Acute RIGHT femoral neck fracture. Preoperative respiratory evaluation. EXAM: PORTABLE CHEST 1 VIEW COMPARISON:  09/18/2015, 07/09/2007. FINDINGS: Cardiac silhouette normal in size, unchanged. Stable chronic elevation of the RIGHT hemidiaphragm and chronic scar/atelectasis involving the RIGHT lung base. Lungs otherwise clear. No localized airspace consolidation. Normal bronchovascular markings. No pleural effusions. No pneumothorax. Normal pulmonary vascularity. IMPRESSION: No acute cardiopulmonary disease. Stable chronic elevation of the RIGHT hemidiaphragm and chronic scar/atelectasis involving the RIGHT lung base. Electronically Signed   By: Hulan Saashomas  Lawrence M.D.   On: 10/08/2018 09:32   Dg Hip Unilat  With Pelvis 2-3 Views Right  Result Date: 10/08/2018 CLINICAL DATA:  Right hip pain secondary to a fall. EXAM: DG HIP (WITH OR WITHOUT PELVIS) 2-3V RIGHT COMPARISON:  None.  FINDINGS: There is a slightly displaced overriding right femoral neck fracture. Bones are otherwise normal. IMPRESSION: Right femoral neck fracture. Electronically Signed   By: Francene BoyersJames  Maxwell M.D.   On: 10/08/2018 08:33    Follow up with PCP in 1 week.  Management plans discussed with the patient, family and they are in agreement.  CODE STATUS:     Code Status Orders  (From admission, onward)         Start     Ordered   10/08/18 1230  Do not attempt resuscitation (DNR)  Continuous    Question Answer Comment  In the event of cardiac or respiratory ARREST Do not call a "code blue"   In the event of cardiac or respiratory ARREST Do not perform Intubation, CPR, defibrillation or ACLS   In the event of cardiac or respiratory  ARREST Use medication by any route, position, wound care, and other measures to relive pain and suffering. May use oxygen, suction and manual treatment of airway obstruction as needed for comfort.      10/08/18 1230        Code Status History    Date Active Date Inactive Code Status Order ID Comments User Context   10/08/2018 0939 10/08/2018 1230 DNR [id number]  Milagros LollSudini, Ajahni Nay, MD ED    Advance Directive Documentation     Most Recent Value  Type of Advance Directive  Out of facility DNR (pink MOST or yellow form)  Pre-existing out of facility DNR order (yellow form or pink MOST form)  -  "MOST" Form in Place?  -      TOTAL TIME TAKING CARE OF THIS PATIENT ON DAY OF DISCHARGE: more than 30 minutes.   Orie FishermanSrikar R Liesa Tsan M.D on 10/09/2018 at 8:34 AM  Between 7am to 6pm - Pager - [phone]  After 6pm go to [email or web address] - password [email or web address]  [facility]  Office  [phone]  CC: Primary care physician; [facility]  Note: This dictation was prepared with Dragon dictation along with smaller phrase technology. Any transcriptional errors that result from this process are unintentional.

## 2018-10-09 NOTE — Progress Notes (Signed)
Notified EMS for non emergent pickup/transfer.  Notified Pt's husband Sherilyn CooterHenry of transfer. Questions asked and answered.

## 2018-10-09 NOTE — Progress Notes (Signed)
Nsg Discharge Note  Admit Date:  10/08/2018 Discharge date: 10/09/2018   Vergia AlconJane K Roser to be D/C'd Skilled nursing facility/Hospice Home per MD order.  AVS completed.  Copy for chart, and copy for patient signed, and dated. Patient/caregiver able to verbalize understanding.  Discharge Medication: Allergies as of 10/09/2018      Reactions   Codeine Nausea And Vomiting   Erythromycin Nausea And Vomiting   Meperidine Hcl Nausea And Vomiting      Medication List    STOP taking these medications   acetaminophen 500 MG tablet Commonly known as:  TYLENOL   aspirin 81 MG chewable tablet   CALCIUM-MAGNESIUM-ZINC PO   carvedilol 3.125 MG tablet Commonly known as:  COREG   colchicine 0.6 MG tablet   [employer] BACITRACIN ointment Generic drug:  bacitracin   ezetimibe-simvastatin 10-40 MG tablet Commonly known as:  VYTORIN   guaiFENesin 100 MG/5ML liquid Commonly known as:  ROBITUSSIN   loperamide 2 MG capsule Commonly known as:  IMODIUM   magnesium hydroxide 400 MG/5ML suspension Commonly known as:  MILK OF MAGNESIA   memantine 10 MG tablet Commonly known as:  NAMENDA   MINTOX REGULAR STRENGTH 200-200-20 MG/5ML suspension Generic drug:  alum & mag hydroxide-simeth   multivitamin with minerals Tabs tablet   omeprazole 20 MG capsule Commonly known as:  PRILOSEC   QUEtiapine 25 MG tablet Commonly known as:  SEROQUEL   senna 8.6 MG Tabs tablet Commonly known as:  SENOKOT   vitamin B-12 500 MCG tablet Commonly known as:  CYANOCOBALAMIN     TAKE these medications   LORazepam 1 MG tablet Commonly known as:  ATIVAN Take 1 mg by mouth every 8 (eight) hours.       Discharge Assessment: Vitals:   10/08/18 2008 10/08/18 2314  BP:  100/67  Pulse: 61 (!) 59  Resp:  14  Temp: 97.8 F (36.6 C) 99 F (37.2 C)  SpO2: 93% 100%   Skin clean, dry and intact without evidence of skin break down, no evidence of skin tears noted. IV catheter discontinued intact. Site  without signs and symptoms of complications - no redness or edema noted at insertion site, patient denies c/o pain - only slight tenderness at site.  Dressing with slight pressure applied.  D/c Instructions-Education: Discharge instructions given to patient/family with verbalized understanding. D/c education completed with patient/family including follow up instructions, medication list, d/c activities limitations if indicated, with other d/c instructions as indicated by MD - patient able to verbalize understanding, all questions fully answered. Patient instructed to return to ED, call 911, or call MD for any changes in condition.  Patient escorted via WC, and D/C home via private auto.  Adair LaundryElizabeth A Jalie Eiland, RN 10/09/2018 10:17 AM

## 2018-10-09 NOTE — Clinical Social Work Note (Addendum)
The CSW received a consult for hospice home placement. The CSW has placed a call to hospice and is waiting for a call back regarding bed availability. CSW is following.   UPDATE: The RNCM advised this CSW that he had a voicemail confirming that the patient can discharge to hospice home today. The CSW has again placed a call to the admissions coordinator and left a message. The CSW will continue to follow.  UPDATE: The patient will discharge today to the [facility] via non-emergent EMS. The patient's family and facility are aware and in agreement. The CSW has delivered the discharge packet and has faxed the discharge summary to the facility. The CSW is signing off. Please consult should needs arise.  Argentina PonderKaren Martha Virginia Curl, MSW, Theresia MajorsLCSWA [phone]

## 2018-10-09 NOTE — Discharge Instructions (Signed)
Hip Fracture  A hip fracture is a break in the upper part of the thigh bone (femur). This is usually the result of an injury, commonly a fall. What are the causes? This condition may be caused by:  A direct hit or injury (trauma) to the side of the hip, such as from a fall or a car accident. What increases the risk? You are more likely to develop this condition if:  You have poor balance or an unsteady walking pattern (gait). Certain conditions contribute to poor balance, including Parkinson disease and dementia.  You have thinning or weakening of your bones, such as from osteopenia or osteoporosis.  You have cancer that spreads to the leg bones.  You have certain conditions that can weaken your bones, such as thyroid disorders, intestine disorders, or a lack (deficiency) of certain nutrients.  You smoke.  You take certain medicines, such as steroids.  You have a history of broken bones. What are the signs or symptoms? Symptoms of this condition include:  Pain over the injured hip. This is commonly felt on the side of the hip or in the front groin area.  Stiffness, bruising, and swelling over the hip.  Pain with movement of the leg, especially lifting it up. Pain often gets better with rest.  Difficulty or inability to stand, walk, or use the leg to support body weight (put weight on the leg).  The leg rolling outward when lying down.  The affected leg being shorter than the other leg. How is this diagnosed? This condition may be diagnosed based on:  Your symptoms.  A physical exam.  X-rays. These may be done: ? To confirm the diagnosis. ? To determine the type and location of the fracture. ? To check for other injuries.  MRI or CT scans. These may be done if the fracture is not visible on an X-ray. How is this treated? Treatment for this condition depends on the severity and location of your fracture. In most cases, surgery is necessary. Surgery may  involve:  Repairing the fracture with a screw, nail, or rod to hold the bone in place (open reduction and internal fixation, ORIF).  Replacing the damaged parts of the femur with metal implants (hemiarthroplasty or arthroplasty). If your fracture is less severe, or if you are not eligible for surgery, you may have non-surgical treatment. Non-surgical treatment may involve:  Using crutches, a walker, or a wheelchair until your health care provider says that you can support (bear) weight on your hip.  Medicines to help reduce pain and swelling.  Having regular X-rays to monitor your fracture and make sure that it is healing.  Physical therapy. You may need physical therapy after surgery, too. Follow these instructions at home: Activity  Do not use your injured leg to support your body weight until your health care provider says that you can. ? Follow standing and walking restrictions as told by your health care provider. ? Use crutches, a walker, or a wheelchair as directed.  Avoid any activities that cause pain or irritation in your hip. Ask your health care provider what activities are safe for you.  Do not drive or use heavy machinery until your health care provider approves.  If physical therapy was prescribed, do exercises as told by your health care provider. General instructions  Take over-the-counter and prescription medicines only as told by your health care provider.  If directed, put ice on the injured area: ? Put ice in a plastic bag. ?   Place a towel between your skin and the bag. ? Leave the ice on for 20 minutes, 2-3 times a day.  Do not use any products that contain nicotine or tobacco, such as cigarettes and e-cigarettes. These can delay bone healing. If you need help quitting, ask your health care provider.  Keep all follow-up visits as told by your health care provider. This is important. How is this prevented?  To prevent falls at home: ? Use a cane, walker,  or wheelchair as directed. ? Make sure your rooms and hallways are free of clutter, obstacles, and cords. ? Install grab bars in your bedroom and bathrooms. ? Always use handrails when going up and down stairs. ? Use nightlights around the house.  Exercise regularly. Ask what forms of exercise are safe for you, such as walking and strength and balance exercises.  Visit an eye doctor regularly to have your eyesight checked. This can help prevent falls.  Make sure you get enough calcium and vitamin D.  Do not use any products that contain nicotine or tobacco, such as cigarettes and e-cigarettes. If you need help quitting, ask your health care provider.  Limit alcohol use.  If you have an underlying condition that caused your hip fracture, work with your health care provider to manage your condition. Contact a health care provider if:  Your pain gets worse or it does not get better with rest or medicine.  You develop any of the following in your leg or foot: ? Numbness. ? Tingling. ? A change in skin color (discoloration). ? Skin feeling cold to the touch. Get help right away if:  Your pain suddenly gets worse.  You cannot move your hip. Summary  A hip fracture is a break in the upper part of the thigh bone (femur).  Treatment typically require surgical management to restore stability and function to the hip.  Pain medicine and icing of the affected leg can help manage pain and swelling. Follow directions as told by your health care provider. This information is not intended to replace advice given to you by your health care provider. Make sure you discuss any questions you have with your health care provider. Document Released: 09/28/2005 Document Revised: 06/18/2018 Document Reviewed: 10/31/2016 Elsevier Interactive Patient Education  2019 [employer]  

## 2018-10-09 NOTE — Progress Notes (Signed)
Called report to [facility], Veterinary surgeonobin RN. Questions asked and answered.

## 2018-11-12 DEATH — deceased
